# Patient Record
Sex: Female | Born: 1935 | Race: White | Hispanic: No | Marital: Married | Smoking: Never smoker
Health system: Southern US, Community
[De-identification: ages and names within clinical notes are randomized; demographics above are authoritative.]

## PROBLEM LIST (undated history)

## (undated) DIAGNOSIS — N189 Chronic kidney disease, unspecified: Secondary | ICD-10-CM

## (undated) DIAGNOSIS — F0390 Unspecified dementia without behavioral disturbance: Secondary | ICD-10-CM

## (undated) DIAGNOSIS — I10 Essential (primary) hypertension: Secondary | ICD-10-CM

## (undated) DIAGNOSIS — E119 Type 2 diabetes mellitus without complications: Secondary | ICD-10-CM

## (undated) HISTORY — PX: CHOLECYSTECTOMY: SHX55

## (undated) HISTORY — PX: ABDOMINAL HYSTERECTOMY: SHX81

---

## 2003-12-07 ENCOUNTER — Ambulatory Visit: Payer: Self-pay | Admitting: Ophthalmology

## 2003-12-13 ENCOUNTER — Ambulatory Visit: Payer: Self-pay | Admitting: Ophthalmology

## 2004-10-05 ENCOUNTER — Ambulatory Visit: Payer: Self-pay | Admitting: Unknown Physician Specialty

## 2005-02-22 ENCOUNTER — Emergency Department: Payer: Self-pay | Admitting: Emergency Medicine

## 2006-01-06 ENCOUNTER — Ambulatory Visit: Payer: Self-pay | Admitting: Unknown Physician Specialty

## 2006-03-11 ENCOUNTER — Ambulatory Visit: Payer: Self-pay | Admitting: Unknown Physician Specialty

## 2007-02-23 ENCOUNTER — Ambulatory Visit: Payer: Self-pay | Admitting: Unknown Physician Specialty

## 2008-06-24 ENCOUNTER — Ambulatory Visit: Payer: Self-pay | Admitting: Unknown Physician Specialty

## 2009-06-07 ENCOUNTER — Ambulatory Visit: Payer: Self-pay | Admitting: Unknown Physician Specialty

## 2010-06-14 ENCOUNTER — Ambulatory Visit: Payer: Self-pay | Admitting: Unknown Physician Specialty

## 2011-07-30 ENCOUNTER — Ambulatory Visit: Payer: Self-pay | Admitting: Unknown Physician Specialty

## 2012-05-07 ENCOUNTER — Emergency Department: Payer: Self-pay | Admitting: Emergency Medicine

## 2012-05-07 LAB — COMPREHENSIVE METABOLIC PANEL
Alkaline Phosphatase: 93 U/L (ref 50–136)
Anion Gap: 6 — ABNORMAL LOW (ref 7–16)
BUN: 37 mg/dL — ABNORMAL HIGH (ref 7–18)
Bilirubin,Total: 0.3 mg/dL (ref 0.2–1.0)
Co2: 27 mmol/L (ref 21–32)
EGFR (African American): 26 — ABNORMAL LOW
EGFR (Non-African Amer.): 22 — ABNORMAL LOW
Glucose: 125 mg/dL — ABNORMAL HIGH (ref 65–99)
Osmolality: 282 (ref 275–301)
Potassium: 4.8 mmol/L (ref 3.5–5.1)
SGOT(AST): 19 U/L (ref 15–37)
SGPT (ALT): 14 U/L (ref 12–78)
Sodium: 136 mmol/L (ref 136–145)

## 2012-05-07 LAB — CBC
HGB: 14.2 g/dL (ref 12.0–16.0)
MCHC: 33.8 g/dL (ref 32.0–36.0)
MCV: 91 fL (ref 80–100)
Platelet: 243 10*3/uL (ref 150–440)
RBC: 4.66 10*6/uL (ref 3.80–5.20)
WBC: 10.6 10*3/uL (ref 3.6–11.0)

## 2012-08-04 ENCOUNTER — Ambulatory Visit: Payer: Self-pay

## 2014-03-02 ENCOUNTER — Inpatient Hospital Stay: Payer: Self-pay | Admitting: Internal Medicine

## 2014-03-02 LAB — CBC
HCT: 40.2 % (ref 35.0–47.0)
HGB: 13.4 g/dL (ref 12.0–16.0)
MCH: 29.9 pg (ref 26.0–34.0)
MCHC: 33.3 g/dL (ref 32.0–36.0)
MCV: 90 fL (ref 80–100)
Platelet: 208 10*3/uL (ref 150–440)
RBC: 4.48 10*6/uL (ref 3.80–5.20)
RDW: 14.3 % (ref 11.5–14.5)
WBC: 8.9 10*3/uL (ref 3.6–11.0)

## 2014-03-02 LAB — CK TOTAL AND CKMB (NOT AT ARMC)
CK, Total: 572 U/L — ABNORMAL HIGH (ref 26–192)
CK-MB: 1.7 ng/mL (ref 0.5–3.6)

## 2014-03-02 LAB — COMPREHENSIVE METABOLIC PANEL
ALBUMIN: 3 g/dL — AB (ref 3.4–5.0)
ALK PHOS: 100 U/L
Anion Gap: 7 (ref 7–16)
BUN: 85 mg/dL — ABNORMAL HIGH (ref 7–18)
Bilirubin,Total: 0.4 mg/dL (ref 0.2–1.0)
CALCIUM: 8.8 mg/dL (ref 8.5–10.1)
Chloride: 98 mmol/L (ref 98–107)
Co2: 29 mmol/L (ref 21–32)
Creatinine: 3.82 mg/dL — ABNORMAL HIGH (ref 0.60–1.30)
EGFR (African American): 14 — ABNORMAL LOW
EGFR (Non-African Amer.): 12 — ABNORMAL LOW
GLUCOSE: 237 mg/dL — AB (ref 65–99)
Osmolality: 302 (ref 275–301)
Potassium: 3.9 mmol/L (ref 3.5–5.1)
SGOT(AST): 38 U/L — ABNORMAL HIGH (ref 15–37)
SGPT (ALT): 16 U/L
Sodium: 134 mmol/L — ABNORMAL LOW (ref 136–145)
TOTAL PROTEIN: 7.1 g/dL (ref 6.4–8.2)

## 2014-03-02 LAB — URINALYSIS, COMPLETE
BILIRUBIN, UR: NEGATIVE
Bacteria: NEGATIVE
Blood: NEGATIVE
Glucose,UR: NEGATIVE mg/dL (ref 0–75)
KETONE: NEGATIVE
Leukocyte Esterase: NEGATIVE
Nitrite: NEGATIVE
PH: 5 (ref 4.5–8.0)
PROTEIN: NEGATIVE
SPECIFIC GRAVITY: 1.005 (ref 1.003–1.030)
WBC UR: NONE SEEN /HPF (ref 0–5)

## 2014-03-02 LAB — TROPONIN I: Troponin-I: <0.02 ng/mL

## 2014-03-02 LAB — PROTIME-INR
INR: 1
Prothrombin Time: 12.9 secs (ref 11.5–14.7)

## 2014-03-02 LAB — HEMOGLOBIN A1C: HEMOGLOBIN A1C: 9.6 % — AB (ref 4.2–6.3)

## 2014-03-02 LAB — APTT: Activated PTT: 26.9 secs (ref 23.6–35.9)

## 2014-03-02 LAB — PRO B NATRIURETIC PEPTIDE: B-TYPE NATIURETIC PEPTID: 207 pg/mL (ref 0–450)

## 2014-03-03 LAB — CBC WITH DIFFERENTIAL/PLATELET
BASOS PCT: 0.5 %
Basophil #: 0 10*3/uL (ref 0.0–0.1)
EOS PCT: 3.7 %
Eosinophil #: 0.3 10*3/uL (ref 0.0–0.7)
HCT: 36.5 % (ref 35.0–47.0)
HGB: 12.1 g/dL (ref 12.0–16.0)
Lymphocyte #: 1.1 10*3/uL (ref 1.0–3.6)
Lymphocyte %: 13.2 %
MCH: 30.1 pg (ref 26.0–34.0)
MCHC: 33.2 g/dL (ref 32.0–36.0)
MCV: 91 fL (ref 80–100)
Monocyte #: 1 x10 3/mm — ABNORMAL HIGH (ref 0.2–0.9)
Monocyte %: 11.6 %
NEUTROS PCT: 71 %
Neutrophil #: 5.9 10*3/uL (ref 1.4–6.5)
Platelet: 194 10*3/uL (ref 150–440)
RBC: 4.03 10*6/uL (ref 3.80–5.20)
RDW: 14 % (ref 11.5–14.5)
WBC: 8.4 10*3/uL (ref 3.6–11.0)

## 2014-03-03 LAB — BASIC METABOLIC PANEL
Anion Gap: 10 (ref 7–16)
BUN: 79 mg/dL — ABNORMAL HIGH (ref 7–18)
CALCIUM: 8.2 mg/dL — AB (ref 8.5–10.1)
Chloride: 104 mmol/L (ref 98–107)
Co2: 26 mmol/L (ref 21–32)
Creatinine: 3.11 mg/dL — ABNORMAL HIGH (ref 0.60–1.30)
GFR CALC AF AMER: 18 — AB
GFR CALC NON AF AMER: 15 — AB
GLUCOSE: 185 mg/dL — AB (ref 65–99)
OSMOLALITY: 308 (ref 275–301)
Potassium: 3.5 mmol/L (ref 3.5–5.1)
Sodium: 140 mmol/L (ref 136–145)

## 2014-03-03 LAB — MAGNESIUM: Magnesium: 2.9 mg/dL — ABNORMAL HIGH

## 2014-03-04 LAB — BASIC METABOLIC PANEL
ANION GAP: 8 (ref 7–16)
BUN: 60 mg/dL — ABNORMAL HIGH (ref 7–18)
CREATININE: 2.46 mg/dL — AB (ref 0.60–1.30)
Calcium, Total: 8.4 mg/dL — ABNORMAL LOW (ref 8.5–10.1)
Chloride: 110 mmol/L — ABNORMAL HIGH (ref 98–107)
Co2: 25 mmol/L (ref 21–32)
EGFR (Non-African Amer.): 20 — ABNORMAL LOW
GFR CALC AF AMER: 24 — AB
Glucose: 91 mg/dL (ref 65–99)
Osmolality: 301 (ref 275–301)
Potassium: 3.4 mmol/L — ABNORMAL LOW (ref 3.5–5.1)
Sodium: 143 mmol/L (ref 136–145)

## 2014-03-05 LAB — BASIC METABOLIC PANEL
ANION GAP: 9 (ref 7–16)
BUN: 45 mg/dL — AB (ref 7–18)
CHLORIDE: 111 mmol/L — AB (ref 98–107)
CO2: 25 mmol/L (ref 21–32)
CREATININE: 2.11 mg/dL — AB (ref 0.60–1.30)
Calcium, Total: 8.4 mg/dL — ABNORMAL LOW (ref 8.5–10.1)
EGFR (African American): 28 — ABNORMAL LOW
EGFR (Non-African Amer.): 23 — ABNORMAL LOW
Glucose: 89 mg/dL (ref 65–99)
Osmolality: 300 (ref 275–301)
POTASSIUM: 3.6 mmol/L (ref 3.5–5.1)
Sodium: 145 mmol/L (ref 136–145)

## 2014-03-05 LAB — URINE CULTURE

## 2014-06-12 NOTE — Discharge Summary (Signed)
Dates of Admission and Diagnosis:  Date of Admission 02-Mar-2014   Date of Discharge 01-Jan-0001   Admitting Diagnosis Weakness, Acute on chronic renal failure, CKD 4, recurrent UTI with multidrug resistant organism   Final Diagnosis Weakness, Acute on chronic renal failure, CKD 4, recurrent UTI with multidrug resistant organism   Discharge Diagnosis 1 Weakness, Acute on chronic renal failure, CKD 4, recurrent UTI with multidrug resistant organism   2 Diabetes   3 Hypertension   4 Hyperlipidemia    Chief Complaint/History of Present Illness 81 year old lady with h/o recurrent UTI with multi-drug resistant organisms, (treated with Invanz in the recent past) Diabetes, Hypertension and Hyperlipidemia  presented with c/o weakness. ED work up revealed  Acute on chronic renal failure: Cr 3.82 and eGFR 12 on admission. Baseline Cr 2.2 and eGFR 21 on 11/22/2013.   Allergies:  PCN: Unknown  Sulfa drugs: Unknown    Hepatic:  20-Jan-16 10:54   Bilirubin, Total 0.4  Alkaline Phosphatase 100 (46-116 NOTE: New Reference Range 08/31/13)  SGPT (ALT) 16 (14-63 NOTE: New Reference Range 08/31/13)  SGOT (AST)  38  Total Protein, Serum 7.1  Albumin, Serum  3.0  Routine Micro:  21-Jan-16 18:49   Micro Text Report URINE CULTURE   COMMENT                   NO GROWTH IN 36 HOURS   ANTIBIOTIC                       Specimen Source CLEAN CATCH  Culture Comment NO GROWTH IN 9 HOURS  Result(s) reported on 05 Mar 2014 at 10:43AM.  Routine Chem:  20-Jan-16 10:54   Glucose, Serum  237  BUN  85  Creatinine (comp)  3.82  Sodium, Serum  134  Potassium, Serum 3.9  Chloride, Serum 98  CO2, Serum 29  Calcium (Total), Serum 8.8  Anion Gap 7  Osmolality (calc) 302  eGFR (African American)  14  eGFR (Non-African American)  12 (eGFR values <75mL/min/1.73 m2 may be an indication of chronic kidney disease (CKD). Calculated eGFR, using the MRDR Study equation, is useful in  patients with stable  renal function. The eGFR calculation will not be reliable in acutely ill patients when serum creatinine is changing rapidly. It is not useful in patients on dialysis. The eGFR calculation may not be applicable to patients at the low and high extremes of body sizes, pregnant women, and vegetarians.)  B-Type Natriuretic Peptide [facility]) 207 (Result(s) reported on 02 Mar 2014 at 11:28AM.)  21-Jan-16 04:38   Creatinine (comp)  3.11  eGFR (Non-African American)  15 (eGFR values <68mL/min/1.73 m2 may be an indication of chronic kidney disease (CKD). Calculated eGFR, using the MRDR Study equation, is useful in  patients with stable renal function. The eGFR calculation will not be reliable in acutely ill patients when serum creatinine is changing rapidly. It is not useful in patients on dialysis. The eGFR calculation may not be applicable to patients at the low and high extremes of body sizes, pregnant women, and vegetarians.)  22-Jan-16 04:48   Creatinine (comp)  2.46  eGFR (Non-African American)  20 (eGFR values <78mL/min/1.73 m2 may be an indication of chronic kidney disease (CKD). Calculated eGFR, using the MRDR Study equation, is useful in  patients with stable renal function. The eGFR calculation will not be reliable in acutely ill patients when serum creatinine is changing rapidly. It is not useful in patients on dialysis.  The eGFR calculation may not be applicable to patients at the low and high extremes of body sizes, pregnant women, and vegetarians.)  23-Jan-16 04:51   Glucose, Serum 89  BUN  45  Creatinine (comp)  2.11  Sodium, Serum 145  Potassium, Serum 3.6  Chloride, Serum  111  CO2, Serum 25  Calcium (Total), Serum  8.4  Anion Gap 9  Osmolality (calc) 300  eGFR (African American)  28  eGFR (Non-African American)  23 (eGFR values <28mL/min/1.73 m2 may be an indication of chronic kidney disease (CKD). Calculated eGFR, using the MRDR Study equation, is useful in   patients with stable renal function. The eGFR calculation will not be reliable in acutely ill patients when serum creatinine is changing rapidly. It is not useful in patients on dialysis. The eGFR calculation may not be applicable to patients at the low and high extremes of body sizes, pregnant women, and vegetarians.)  Cardiac:  20-Jan-16 10:54   Troponin I < 0.02 (0.00-0.05 0.05 ng/mL or less: NEGATIVE  Repeat testing in 3-6 hrs  if clinically indicated. >0.05 ng/mL: POTENTIAL  MYOCARDIAL INJURY. Repeat  testing in 3-6 hrs if  clinically indicated. NOTE: An increase or decrease  of 30% or more on serial  testing suggests a  clinically important change)  CK, Total  572  CPK-MB, Serum 1.7 (Result(s) reported on 02 Mar 2014 at 11:28AM.)  Routine UA:  20-Jan-16 10:10   Color (UA) YELLOW  Clarity (UA) CLOUDY  Glucose (UA) NEGATIVE  Bilirubin (UA) NEGATIVE  Ketones (UA) NEGATIVE  Specific Gravity (UA) 1.005  Blood (UA) NEGATIVE  pH (UA) 5.0  Protein (UA) NEGATIVE  Nitrite (UA) NEGATIVE  Leukocyte Esterase (UA) NEGATIVE (Result(s) reported on 02 Mar 2014 at 12:06PM.)  RBC (UA) RARE  WBC (UA) NONE SEEN  Bacteria (UA) NEGATIVE  Epithelial Cells (UA) 0-5 / HPF  Mucous (UA) PRESENT  Routine Coag:  20-Jan-16 10:54   Prothrombin 12.9  INR 1.0 (INR reference interval applies to patients on anticoagulant therapy. A single INR therapeutic range for coumarins is not optimal for all indications; however, the suggested range for most indications is 2.0 - 3.0. Exceptions to the INR Reference Range may include: Prosthetic heart valves, acute myocardial infarction, prevention of myocardial infarction, and combinations of aspirin and anticoagulant. The need for a higher or lower target INR must be assessed individually. Reference: The Pharmacology and Management of the Vitamin K  antagonists: the seventh ACCP Conference on Antithrombotic and Thrombolytic Therapy. JKDTO.6712 Sept:126  (3suppl): [id number]. A HCT value >55% may artifactually increase the PT.  In one study,  the increase was an average of 25%. Reference:  "Effect on Routine and Special Coagulation Testing Values of Citrate Anticoagulant Adjustment in Patients with High HCT Values." American Journal of Clinical Pathology 2006;126:400-405.)  Activated PTT (APTT) 26.9 (A HCT value >55% may artifactually increase the APTT. In one study, the increase was an average of 19%. Reference: "Effect on Routine and Special Coagulation Testing Values of Citrate Anticoagulant Adjustment in Patients with High HCT Values." American Journal of Clinical Pathology 2006;126:400-405.)  Routine Hem:  20-Jan-16 10:54   WBC (CBC) 8.9  RBC (CBC) 4.48  Hemoglobin (CBC) 13.4  Hematocrit (CBC) 40.2  Platelet Count (CBC) 208 (Result(s) reported on 02 Mar 2014 at 11:17AM.)  MCV 90  MCH 29.9  MCHC 33.3  RDW 14.3   PERTINENT RADIOLOGY STUDIES: XRay:    20-Jan-16 10:31, Chest PA and Lateral  Chest PA and Lateral   REASON FOR  EXAM:    generalized weakness  COMMENTS:       PROCEDURE: DXR - DXR CHEST PA (OR AP) AND LATERAL  - Mar 02 2014 10:31AM     CLINICAL DATA:  Recent fall, generalized weakness    EXAM:  CHEST  2 VIEW    COMPARISON:  None.    FINDINGS:  Cardiac shadow is at the upper limits of normal in size. The overall  inspiratory effort is poor. Elevation of the right hemidiaphragm is  seen. Mild bibasilar atelectatic changes are seen. No focal  confluent infiltrate is noted. Postsurgical changes are noted in the  right axilla.     IMPRESSION:  Mild bibasilar atelectasis without focal confluent infiltrate.      Electronically Signed    By: Inez Catalina M.D.    On: 03/02/2014 10:51         Verified By: Everlene Farrier, M.D.,  [place]:    21-Jan-16 13:34, US Kidney Bilateral  US Kidney Bilateral   REASON FOR EXAM:    ARF, Cr 3.82  COMMENTS:       PROCEDURE: [place]  - US KIDNEY  - Mar 03 2014  1:34PM      CLINICAL DATA:  Acute renal failure.    EXAM:  RENAL/URINARY TRACT ULTRASOUND COMPLETE    COMPARISON:  None.    FINDINGS:  Right Kidney:  Length: 10.5 cm. Diffuse renal parenchymal thinning and increased  echogenicity noted. No mass or hydronephrosis visualized.    Left Kidney:    Length: 10.2 cm. Diffuse renal parenchymal thinning and increased  echogenicity noted. No mass or hydronephrosis visualized.    Bladder:    Appears normal for degree of bladder distention.     IMPRESSION:  Bilateral diffuse renal parenchymal thinning and increased  echogenicity, consistent with medical renal disease. No evidence of  hydronephrosis.      Electronically Signed    By: Earle Gell M.D.    On: 03/03/2014 13:41         Verified By: Marlaine Hind, M.D.,  CT:    20-Jan-16 11:00, CT Head Without Contrast  CT Head Without Contrast   REASON FOR EXAM:    generalized weakness  COMMENTS:       PROCEDURE: CT  - CT HEAD WITHOUT CONTRAST  - Mar 02 2014 11:00AM     CLINICAL DATA:  81 year old female with acute onset weakness and  lethargy. Initial encounter.    EXAM:  CT HEAD WITHOUT CONTRAST    TECHNIQUE:  Contiguous axial images were obtained from the base of the skull  through the vertex without intravenous contrast.  COMPARISON:  Normal noncontrast CT appearance of the brain. MRI  06/24/2008 and earlier.    FINDINGS:  No acute osseous abnormality identified. Visualized paranasal  sinuses and mastoids are clear. No acute orbit or scalp soft tissue  findings.    Calcified atherosclerosis at the skull base. No midline shift, mass  effect, or evidence of intracranial mass lesion. No  ventriculomegaly. Cerebral volume is within normal limits for age.  Mild for age scattered white matter hypodensity, nonspecific. No  evidence of cortically based acute infarction identified. No  suspicious intracranial vascular hyperdensity. Noacute intracranial  hemorrhage identified.    IMPRESSION:  No acute intracranial abnormality. Mild for age nonspecific white  matter changes.      Electronically Signed    By: Lars Pinks M.D.    On: 03/02/2014 11:40  Verified By: Gwenyth Bender. HALL,M.D.,   Pertinent Past History:  Pertinent Past History Stage 4 CKD Diabetes Hypertension Hyperlipidemia   Hospital Course:  Hospital Course 81 year old lady with h/o Diabetes, Hypertension and Hyperlipidemia admitted with weakness and Acute Renal Failure. h/o recurrent UTI with multi-drug resistant organisms, (treated with Invanz in the recent past). Initial Cr 3.82 on admission, improved gradually with IV hydration to 2.11 today. Baseline Cr 2.2 and eGFR 21 on 11/22/2013. Renal u/s and Nephrology consult were done. Negative urinalysis. Preliminary urine culture is negative. Colbert Ewing was discontinued. Negative CXR and CT head. Diabetes and hypertension were controlled with usual medications. Exam: Awake and Alert, NAD. HENT: no JVD, Chest: clear, CVS: RRR, Abdo: benign, Ext: no edema. Discharge home today. Schedule Urology eval as out-patient.   Condition on Discharge Stable   DISCHARGE INSTRUCTIONS HOME MEDS:  Medication Reconciliation: Patient's Home Medications at Discharge:     Medication Instructions  cozaar 100 mg oral tablet  0.5 tab(s) orally once a day   pravastatin 40 mg oral tablet  1 tab(s) orally once a day (at bedtime)   glimepiride 2 mg oral tablet  1 tab(s) orally once a day with breakfast   potassium chloride extended release 10 meq oral tablet, extended release  1 tab(s) orally once a day   furosemide 40 mg oral tablet  1 tab(s) orally once a day   donepezil 10 mg oral tablet  1 tab(s) orally once a day   novolog flexpen 100 units/ml subcutaneous solution  0-5 unit(s) subcutaneous, per sliding scale, TID with meals    STOP TAKING THE FOLLOWING MEDICATION(S):    lidocaine 1% injectable solution: 3.2 milliliter(s) injectable once a day, used to dilute  invanz inj. invanz 1 g injectable powder for injection: 0.5 gram(s) injectable once a day for 14 days  Physician's Instructions:  Diet Low Sodium  Carbohydrate Controlled (ADA) Diet   Activity Limitations As tolerated   Return to Work Not Applicable   Time frame for Follow Up Appointment 1-2 weeks  Dr. Glendon Axe   Time frame for Follow Up Appointment 2-4 weeks  Dr. Berton Mount, Marchele Decock(Attending Physician): [facility], [address], [place], [place] [address], [place], [place](Consultant): [place], [address] [place], [place], [place], [place] [address], [place] [phone]  Electronic Signatures: Glendon Axe (MD)  (Signed 23-Jan-16 13:15)  Authored: ADMISSION DATE AND DIAGNOSIS, CHIEF COMPLAINT/HPI, Allergies, PERTINENT LABS, PERTINENT RADIOLOGY STUDIES, PERTINENT PAST HISTORY, HOSPITAL COURSE, DISCHARGE INSTRUCTIONS HOME MEDS, PATIENT INSTRUCTIONS, Follow Up Physician   Last Updated: 23-Jan-16 13:15 by Glendon Axe (MD)

## 2014-06-12 NOTE — H&P (Signed)
PATIENT NAME:  Heather Curtis, Heather Curtis MR#:  [id number] DATE OF BIRTH:  11/01/1922  DATE OF ADMISSION:  03/02/2014  PRIMARY CARE PHYSICIAN: Miriam "Mimi" Merlinda Frederick, PA-C with Leotis Shames, MD.  REQUESTING PHYSICIAN: Sheryl L. Mindi Junker, MD   CHIEF COMPLAINT: Weakness.  HISTORY OF PRESENT ILLNESS: The patient is an 81 year old female with a known history of hypertension, recurrent UTIs, being admitted for acute renal failure. The patient has been feeling weak and lethargic over the last couple of days. She had recently finished a course of IV Invanz about 10 weeks ago and has been recently started again on the same antibiotic about a week ago, on 14th, for a total 10 days of course, for recurrent UTI by her primary care physician. She has been getting weak since then. She has 24/7 care at home provided by her daughter. She used to have [facility] coming, they have stopped recently, and her daughter is the one providing care, including the injection of Invanz also. This morning she was very weak and was requiring a lot of assistance to bedroom, did not fall. Due to worsening weakness, she was brought down to the Emergency Department via EMS. While in the ED, she was found to have creatinine of 3.82 which is significantly elevated from her baseline and she is being admitted for further evaluation and management.   PAST MEDICAL HISTORY: 1.  Shingles.  2.  Diabetes.  3.  Hypertension.  4.  Recurrent UTI. 5.  Urinary incontinence.  6.  Hyperlipidemia.    ALLERGIES: PENICILLIN AND SULFA DRUGS.   SOCIAL HISTORY: No smoking. No alcohol. She lives at home, has 24/7 home care. She lives with her husband. Her daughter helps out.   FAMILY HISTORY: Father had Parkinson disease, mother with Alzheimer's.   MEDICATIONS AT HOME: 1.  Cozaar 100 mg 1/2 tablet p.o. daily.  2.  Donepezil 10 mg p.o. daily.  3.  Lasix 40 mg p.o. daily. 4.  Glimepiride 2 mg p.o. daily.  5.  Invanz 1 gram, 1/2 tablet injectable  once a day for 14 days.  6.  Lidocaine for mixing the Invanz. 7.  NovoLog Flex 100 units subcutaneous b.i.d.  8.  Potassium chloride 10 mEq p.o. daily.  9.  Pravastatin 40 mg p.o. at bedtime.   REVIEW OF SYSTEMS: CONSTITUTIONAL: No fever. Positive for fatigue and weakness.  EYES: No blurred or double vision. EARS, NOSE, AND THROAT: No tinnitus or ear pain.  RESPIRATORY: No cough or hemoptysis.  CARDIOVASCULAR: No chest pain, orthopnea, edema.  GASTROINTESTINAL: No nausea, vomiting, diarrhea. GENITOURINARY: No dysuria or hematuria. Recurrent UTI. She does have a history of bladder incontinence. ENDOCRINOLOGY: No polyuria or nocturia. HEMATOLOGY: No anemia or easy bruising.  SKIN: No rash or lesion.  MUSCULOSKELETAL: Positive for arthritis. No muscle cramp. NEUROLOGIC: No tingling or numbness. Positive for weakness.  PSYCHIATRY: No history of anxiety or depression.   PHYSICAL EXAMINATION:  VITAL SIGNS: Temperature 97.8, heart rate 82 per minute, respirations 21 per minute, blood pressure 126/60. She is saturating 98% on room air.  GENERAL: The patient is an 81 year old female lying in the bed comfortably without any acute distress.  EYES: Pupils equal, round, reactive to light and accommodation. No scleral icterus. Extraocular muscles intact.  HENT: Head atraumatic, normocephalic. Oropharynx and nasopharynx clear.  NECK: Supple. No jugular venous distention. No thyroid enlargement or tenderness.  LUNGS: Clear to auscultation bilaterally. No wheezing, rales, rhonchi or crepitation.  ABDOMEN: Soft, nontender, obese, nondistended. Bowel sounds present. No organomegaly  or mass.  EXTREMITIES: No pedal edema, cyanosis, clubbing.  NEUROLOGIC: Cranial nerves II-XII intact. Muscle strength 5/5 in extremities. Sensation intact.  PSYCHIATRIC: The patient is alert and oriented x 3.  SKIN: No obvious rash, lesion or ulcer.  CARDIOVASCULAR: S1, S2 normal.  No murmur, rales or  gallop. MUSCULOSKELETAL: No joint effusion or tenderness.   LABORATORY PANEL: Negative UA. Normal coagulation panel. Normal CBC. Normal liver function tests. Normal first set of cardiac enzymes. BMP within normal limits except BUN of 85, creatinine 3.82, sodium of 134, blood sugar of 237.  Chest x-ray showed mild bibasilar atelectasis, no infiltrate.   CT scan of the head without contrast showed no acute intracranial abnormality.   EKG shows normal sinus rhythm, no major ST-T changes.   IMPRESSION AND PLAN:  1.  Acute on chronic kidney disease, stage 3, likely prerenal. We will hold her Lasix and ARB, monitor her renal function very closely, avoid any nephrotoxins. 2.  Weakness, likely metabolic in nature, multifactorial. We will get physical and occupational therapy evaluation and management. She may need a higher level of care. Her daughter is requesting hospital bed and some other DMEs for home use.  3.  Recurrent urinary tract infection. She is being treated with IV Invanz by her PCP. We will continue at this time.  4.  Hypertension. We will continue monitoring her blood pressure. Right now, we will hold her Lasix and ARB considering her renal failure.   CODE STATUS: DNR.   TOTAL TIME TAKING CARE OF THIS PATIENT: Forty-five minutes.   ____________________________ Ellamae SiaVipul S. Sherryll BurgerShah, MD vss:TT D: 03/02/2014 14:14:06 ET T: 03/02/2014 14:50:55 ET JOB#: [id number]  cc: Kazuo Durnil S. Sherryll BurgerShah, MD, <Dictator> Leotis ShamesJasmine Singh, MD Ellamae SiaVIPUL S [facility] MD ELECTRONICALLY SIGNED 03/03/2014 10:16

## 2014-06-13 ENCOUNTER — Ambulatory Visit
Admission: RE | Admit: 2014-06-13 | Discharge: 2014-06-13 | Disposition: A | Discharge status: Home / Self Care | Payer: Medicare Other | Source: Ambulatory Visit | Attending: Family Medicine | Admitting: Family Medicine

## 2014-06-13 ENCOUNTER — Other Ambulatory Visit: Payer: Self-pay | Admitting: Family Medicine

## 2014-06-13 DIAGNOSIS — R059 Cough, unspecified: Secondary | ICD-10-CM

## 2014-06-13 DIAGNOSIS — J9811 Atelectasis: Secondary | ICD-10-CM | POA: Insufficient documentation

## 2014-06-13 DIAGNOSIS — R05 Cough: Secondary | ICD-10-CM

## 2015-02-20 ENCOUNTER — Emergency Department: Payer: Medicare HMO

## 2015-02-20 ENCOUNTER — Emergency Department
Admission: EM | Admit: 2015-02-20 | Discharge: 2015-02-20 | Disposition: A | Discharge status: Home / Self Care | Payer: Medicare HMO | Attending: Emergency Medicine | Admitting: Emergency Medicine

## 2015-02-20 ENCOUNTER — Encounter: Payer: Self-pay | Admitting: Emergency Medicine

## 2015-02-20 DIAGNOSIS — F0390 Unspecified dementia without behavioral disturbance: Secondary | ICD-10-CM | POA: Insufficient documentation

## 2015-02-20 DIAGNOSIS — Z880 Allergy status to penicillin: Secondary | ICD-10-CM | POA: Diagnosis not present

## 2015-02-20 DIAGNOSIS — E119 Type 2 diabetes mellitus without complications: Secondary | ICD-10-CM | POA: Diagnosis not present

## 2015-02-20 DIAGNOSIS — I10 Essential (primary) hypertension: Secondary | ICD-10-CM | POA: Diagnosis not present

## 2015-02-20 DIAGNOSIS — M79674 Pain in right toe(s): Secondary | ICD-10-CM | POA: Diagnosis present

## 2015-02-20 DIAGNOSIS — L03031 Cellulitis of right toe: Secondary | ICD-10-CM

## 2015-02-20 HISTORY — DX: Unspecified dementia, unspecified severity, without behavioral disturbance, psychotic disturbance, mood disturbance, and anxiety: F03.90

## 2015-02-20 HISTORY — DX: Type 2 diabetes mellitus without complications: E11.9

## 2015-02-20 HISTORY — DX: Essential (primary) hypertension: I10

## 2015-02-20 LAB — CBC
HCT: 40.8 % (ref 35.0–47.0)
HEMOGLOBIN: 13.5 g/dL (ref 12.0–16.0)
MCH: 29.3 pg (ref 26.0–34.0)
MCHC: 33 g/dL (ref 32.0–36.0)
MCV: 88.9 fL (ref 80.0–100.0)
Platelets: 206 10*3/uL (ref 150–440)
RBC: 4.6 MIL/uL (ref 3.80–5.20)
RDW: 14.3 % (ref 11.5–14.5)
WBC: 10.2 10*3/uL (ref 3.6–11.0)

## 2015-02-20 LAB — COMPREHENSIVE METABOLIC PANEL
ALBUMIN: 3.4 g/dL — AB (ref 3.5–5.0)
ALK PHOS: 74 U/L (ref 38–126)
ALT: 13 U/L — ABNORMAL LOW (ref 14–54)
ANION GAP: 7 (ref 5–15)
AST: 14 U/L — ABNORMAL LOW (ref 15–41)
BILIRUBIN TOTAL: 0.5 mg/dL (ref 0.3–1.2)
BUN: 31 mg/dL — ABNORMAL HIGH (ref 6–20)
CALCIUM: 9.6 mg/dL (ref 8.9–10.3)
CO2: 22 mmol/L (ref 22–32)
Chloride: 107 mmol/L (ref 101–111)
Creatinine, Ser: 1.82 mg/dL — ABNORMAL HIGH (ref 0.44–1.00)
GFR calc non Af Amer: 23 mL/min — ABNORMAL LOW (ref >60)
GFR, EST AFRICAN AMERICAN: 27 mL/min — AB (ref >60)
Glucose, Bld: 112 mg/dL — ABNORMAL HIGH (ref 65–99)
POTASSIUM: 4.6 mmol/L (ref 3.5–5.1)
SODIUM: 136 mmol/L (ref 135–145)
TOTAL PROTEIN: 6.6 g/dL (ref 6.5–8.1)

## 2015-02-20 MED ORDER — SULFAMETHOXAZOLE-TRIMETHOPRIM 800-160 MG PO TABS: 2.0000 | ORAL_TABLET | Freq: Two times a day (BID) | ORAL | Status: AC

## 2015-02-20 MED ORDER — VANCOMYCIN HCL IN DEXTROSE 1-5 GM/200ML-% IV SOLN
1000.0000 mg | Freq: Once | INTRAVENOUS | Status: AC
  Administered 2015-02-20: 1000 mg via INTRAVENOUS
  Filled 2015-02-20: qty 200

## 2015-02-20 MED ORDER — BACITRACIN ZINC 500 UNIT/GM EX OINT
TOPICAL_OINTMENT | CUTANEOUS | Status: AC
  Administered 2015-02-20: 1
  Filled 2015-02-20: qty 0.9

## 2015-02-20 NOTE — Discharge Instructions (Signed)
Cellulitis °Cellulitis is an infection of the skin and the tissue beneath it. The infected area is usually red and tender. Cellulitis occurs most often in the arms and lower legs.  °CAUSES  °Cellulitis is caused by bacteria that enter the skin through cracks or cuts in the skin. The most common types of bacteria that cause cellulitis are staphylococci and streptococci. °SIGNS AND SYMPTOMS  °· Redness and warmth. °· Swelling. °· Tenderness or pain. °· Fever. °DIAGNOSIS  °Your health care provider can usually determine what is wrong based on a physical exam. Blood tests may also be done. °TREATMENT  °Treatment usually involves taking an antibiotic medicine. °HOME CARE INSTRUCTIONS  °· Take your antibiotic medicine as directed by your health care provider. Finish the antibiotic even if you start to feel better. °· Keep the infected arm or leg elevated to reduce swelling. °· Apply a warm cloth to the affected area up to 4 times per day to relieve pain. °· Take medicines only as directed by your health care provider. °· Keep all follow-up visits as directed by your health care provider. °SEEK MEDICAL CARE IF:  °· You notice red streaks coming from the infected area. °· Your red area gets larger or turns dark in color. °· Your bone or joint underneath the infected area becomes painful after the skin has healed. °· Your infection returns in the same area or another area. °· You notice a swollen bump in the infected area. °· You develop new symptoms. °· You have a fever. °SEEK IMMEDIATE MEDICAL CARE IF:  °· You feel very sleepy. °· You develop vomiting or diarrhea. °· You have a general ill feeling (malaise) with muscle aches and pains. °  °This information is not intended to replace advice given to you by your health care provider. Make sure you discuss any questions you have with your health care provider. °  °Document Released: 11/07/2004 Document Revised: 10/19/2014 Document Reviewed: 04/15/2011 °Elsevier Interactive  Patient Education ©2016 [employer]. ° °Please return immediately if condition worsens. Please contact her primary physician or the physician you were given for referral. If you have any specialist physicians involved in her treatment and plan please also contact them. Thank you for using [facility]. ° °

## 2015-02-20 NOTE — ED Notes (Signed)
Daughter arrives, states mom lives at her home with sitters.

## 2015-02-20 NOTE — ED Notes (Signed)
Patient arrives via EMS with reported R great toe infection. R great toe noted reddened with bloody drainage around nailbed. Patient states she does not know how long it has been sore.

## 2015-02-20 NOTE — ED Notes (Signed)
Patient unable to answer questions re med history etc. Arrives with DNR., await family arrival

## 2015-02-20 NOTE — ED Provider Notes (Signed)
Time Seen: Approximately ----------------------------------------- 3:16 PM on 02/20/2015 -----------------------------------------   I have reviewed the triage notes  Chief Complaint: Toe Pain   History of Present Illness: Heather Curtis is a 82 y.o. female who presents with what seems to be some new onset bleeding on her right foot. Patient now has 24 hour at home care and is currently here with her family. It was noticed by one of her caretakers that she had the redness of the toe. It is unknown how long this has occurred. The patient is unable to offer any history or review of systems herself secondary to significant dementia. Patient is a diabetic. Her blood sugars have remained within normal limits. No obvious known trauma Past Medical History  Diagnosis Date  . Diabetes mellitus without complication (HCC)   . Hypertension   . Dementia     There are no active problems to display for this patient.   Past Surgical History  Procedure Laterality Date  . Abdominal hysterectomy    . Cholecystectomy      Past Surgical History  Procedure Laterality Date  . Abdominal hysterectomy    . Cholecystectomy      No current outpatient prescriptions on file.  Allergies:  Penicillins  Family History: No family history on file.  Social History: Social History  Substance Use Topics  . Smoking status: Never Smoker   . Smokeless tobacco: None  . Alcohol Use: No     Review of Systems:   10 point review of systems was performed and was otherwise negative: Review of systems was acquired from the patient's family Constitutional: No fever Eyes: No visual disturbances ENT: No sore throat, ear pain Cardiac: No chest pain Respiratory: No shortness of breath, wheezing, or stridor Abdomen: No abdominal pain, no vomiting, No diarrhea Endocrine: No weight loss, No night sweats Extremities: No peripheral edema, cyanosis Skin: No rashes, easy bruising Neurologic: No focal weakness,  trouble with speech or swollowing Urologic: No dysuria, Hematuria, or urinary frequency   Physical Exam:  ED Triage Vitals  Enc Vitals Group     BP 02/20/15 1359 154/76 mmHg     Pulse Rate 02/20/15 1359 74     Resp 02/20/15 1359 18     Temp 02/20/15 1359 98.2 F (36.8 C)     Temp Source 02/20/15 1359 Oral     SpO2 02/20/15 1359 95 %     Weight 02/20/15 1359 165 lb (74.844 kg)     Height 02/20/15 1359 5\' 2"  (1.575 m)     Head Cir --      Peak Flow --      Pain Score --      Pain Loc --      Pain Edu? --      Excl. in GC? --     General: Awake , Alert , and Oriented times 3; GCS 15 Head: Normal cephalic , atraumatic Eyes: Pupils equal , round, reactive to light Nose/Throat: No nasal drainage, patent upper airway without erythema or exudate.  Neck: Supple, Full range of motion, No anterior adenopathy or palpable thyroid masses Lungs: Clear to ascultation without wheezes , rhonchi, or rales Heart: Regular rate, regular rhythm without murmurs , gallops , or rubs Abdomen: Soft, non tender without rebound, guarding , or rigidity; bowel sounds positive and symmetric in all 4 quadrants. No organomegaly .        Extremities: 2 examination of the right great toe shows a small amount of old blood which  was cleaned and the patient has erythema mainly on the anterior surface of her first metatarsal region. There is no erythema or induration on the posterior surface of the toe. Redness without any areas of darkening. There is no obvious trauma at this time. No obvious ingrown toenail. The toe was cleaned with alcohol swab and was no active bleeding at this time with what appears to be just redness at the base of the nailbed. Marking was applied to the area of redness that extends toward the base of the toe. The extremities otherwise neurovascularly intact with less than 2 second capillary refill. Neurologic: normal ambulation, Motor symmetric without deficits, sensory intact Skin: warm, dry, no  rashes   Labs:   All laboratory work was reviewed including any pertinent negatives or positives listed below:  Labs Reviewed  CBC  COMPREHENSIVE METABOLIC PANEL   review laboratory work showed no significant changes. She has some baseline renal insufficiency    Radiology:      I personally reviewed the radiologic studies EXAM: RIGHT GREAT TOE  COMPARISON: None.  FINDINGS: Three views of the right first toe submitted. No acute fracture or subluxation. There is mild soft tissue swelling probable cellulitis or infection. No bone destruction or bony erosion to suggest osteomyelitis.  IMPRESSION: No acute fracture or subluxation. No definite evidence of osteomyelitis. Soft tissue swelling distal toe.     ED Course: * Patient's wound was well cleaned and dressed on the right toe. There does not appear to be any indications of a felon. This may be simple paronychia with associated cellulitis. There does not appear to be any signs of an ingrown toenail at this time. Patient was started on IV vancomycin here in emergency department and she'll be discharged on double strength Bactrim. The area of redness was demarcated with a pen and the family was advised to keep close observation to see if it increases over the next 24 hours. Currently afebrile, normal white blood cell count, no significant hyperglycemia, and I felt could be treated on an outpatient basis. I did advise family to have a low threshold to bring the patient back to the emergency department if she does not improve for any of the above mentioned symptoms occur.   Assessment:  Cellulitis right great toe      Plan: * Outpatient management Patient was advised to return immediately if condition worsens. Patient was advised to follow up with their primary care physician or other specialized physicians involved in their outpatient care             Jennye MoccasinBrian S Quigley, MD 02/20/15 [id number]

## 2016-02-16 ENCOUNTER — Inpatient Hospital Stay
Admission: EM | Admit: 2016-02-16 | Discharge: 2016-02-21 | DRG: 682 | Disposition: A | Discharge status: Skilled Nursing Facility | Payer: Medicare HMO | Attending: Internal Medicine | Admitting: Internal Medicine

## 2016-02-16 ENCOUNTER — Emergency Department: Payer: Medicare HMO

## 2016-02-16 DIAGNOSIS — I2510 Atherosclerotic heart disease of native coronary artery without angina pectoris: Secondary | ICD-10-CM | POA: Diagnosis present

## 2016-02-16 DIAGNOSIS — N179 Acute kidney failure, unspecified: Principal | ICD-10-CM | POA: Diagnosis present

## 2016-02-16 DIAGNOSIS — E1122 Type 2 diabetes mellitus with diabetic chronic kidney disease: Secondary | ICD-10-CM | POA: Diagnosis present

## 2016-02-16 DIAGNOSIS — E860 Dehydration: Secondary | ICD-10-CM | POA: Diagnosis present

## 2016-02-16 DIAGNOSIS — G9341 Metabolic encephalopathy: Secondary | ICD-10-CM | POA: Diagnosis present

## 2016-02-16 DIAGNOSIS — B951 Streptococcus, group B, as the cause of diseases classified elsewhere: Secondary | ICD-10-CM | POA: Diagnosis present

## 2016-02-16 DIAGNOSIS — Z794 Long term (current) use of insulin: Secondary | ICD-10-CM

## 2016-02-16 DIAGNOSIS — R627 Adult failure to thrive: Secondary | ICD-10-CM | POA: Diagnosis present

## 2016-02-16 DIAGNOSIS — M6281 Muscle weakness (generalized): Secondary | ICD-10-CM

## 2016-02-16 DIAGNOSIS — B9689 Other specified bacterial agents as the cause of diseases classified elsewhere: Secondary | ICD-10-CM | POA: Diagnosis present

## 2016-02-16 DIAGNOSIS — F0390 Unspecified dementia without behavioral disturbance: Secondary | ICD-10-CM | POA: Diagnosis present

## 2016-02-16 DIAGNOSIS — R778 Other specified abnormalities of plasma proteins: Secondary | ICD-10-CM | POA: Diagnosis present

## 2016-02-16 DIAGNOSIS — Z66 Do not resuscitate: Secondary | ICD-10-CM | POA: Diagnosis present

## 2016-02-16 DIAGNOSIS — R059 Cough, unspecified: Secondary | ICD-10-CM

## 2016-02-16 DIAGNOSIS — R531 Weakness: Secondary | ICD-10-CM

## 2016-02-16 DIAGNOSIS — Z880 Allergy status to penicillin: Secondary | ICD-10-CM

## 2016-02-16 DIAGNOSIS — R05 Cough: Secondary | ICD-10-CM

## 2016-02-16 DIAGNOSIS — N183 Chronic kidney disease, stage 3 (moderate): Secondary | ICD-10-CM | POA: Diagnosis present

## 2016-02-16 DIAGNOSIS — R2681 Unsteadiness on feet: Secondary | ICD-10-CM

## 2016-02-16 DIAGNOSIS — Z79899 Other long term (current) drug therapy: Secondary | ICD-10-CM

## 2016-02-16 DIAGNOSIS — Z23 Encounter for immunization: Secondary | ICD-10-CM

## 2016-02-16 DIAGNOSIS — R7881 Bacteremia: Secondary | ICD-10-CM | POA: Diagnosis not present

## 2016-02-16 DIAGNOSIS — I129 Hypertensive chronic kidney disease with stage 1 through stage 4 chronic kidney disease, or unspecified chronic kidney disease: Secondary | ICD-10-CM | POA: Diagnosis present

## 2016-02-16 DIAGNOSIS — N390 Urinary tract infection, site not specified: Secondary | ICD-10-CM

## 2016-02-16 DIAGNOSIS — Z882 Allergy status to sulfonamides status: Secondary | ICD-10-CM

## 2016-02-16 DIAGNOSIS — Z22322 Carrier or suspected carrier of Methicillin resistant Staphylococcus aureus: Secondary | ICD-10-CM

## 2016-02-16 HISTORY — DX: Chronic kidney disease, unspecified: N18.9

## 2016-02-16 LAB — URINALYSIS, COMPLETE (UACMP) WITH MICROSCOPIC
BILIRUBIN URINE: NEGATIVE
GLUCOSE, UA: NEGATIVE mg/dL
KETONES UR: NEGATIVE mg/dL
NITRITE: NEGATIVE
PROTEIN: 100 mg/dL — AB
Specific Gravity, Urine: 1.014 (ref 1.005–1.030)
pH: 5 (ref 5.0–8.0)

## 2016-02-16 LAB — BASIC METABOLIC PANEL
ANION GAP: 7 (ref 5–15)
BUN: 51 mg/dL — ABNORMAL HIGH (ref 6–20)
CALCIUM: 8.9 mg/dL (ref 8.9–10.3)
CO2: 23 mmol/L (ref 22–32)
Chloride: 108 mmol/L (ref 101–111)
Creatinine, Ser: 2.23 mg/dL — ABNORMAL HIGH (ref 0.44–1.00)
GFR, EST AFRICAN AMERICAN: 21 mL/min — AB (ref >60)
GFR, EST NON AFRICAN AMERICAN: 18 mL/min — AB (ref >60)
Glucose, Bld: 217 mg/dL — ABNORMAL HIGH (ref 65–99)
POTASSIUM: 4.6 mmol/L (ref 3.5–5.1)
Sodium: 138 mmol/L (ref 135–145)

## 2016-02-16 LAB — RAPID INFLUENZA A&B ANTIGENS: Influenza B (ARMC): NEGATIVE

## 2016-02-16 LAB — TROPONIN I: Troponin I: 0.03 ng/mL (ref <0.03)

## 2016-02-16 LAB — CBC
HEMATOCRIT: 39.8 % (ref 35.0–47.0)
HEMOGLOBIN: 13.3 g/dL (ref 12.0–16.0)
MCH: 29.8 pg (ref 26.0–34.0)
MCHC: 33.5 g/dL (ref 32.0–36.0)
MCV: 88.9 fL (ref 80.0–100.0)
Platelets: 172 10*3/uL (ref 150–440)
RBC: 4.47 MIL/uL (ref 3.80–5.20)
RDW: 14.5 % (ref 11.5–14.5)
WBC: 9.6 10*3/uL (ref 3.6–11.0)

## 2016-02-16 LAB — RAPID INFLUENZA A&B ANTIGENS (ARMC ONLY): INFLUENZA A (ARMC): NEGATIVE

## 2016-02-16 MED ORDER — DEXTROSE 5 % IV SOLN: 1.0000 g | Freq: Once | INTRAVENOUS | Status: DC

## 2016-02-16 MED ORDER — SODIUM CHLORIDE 0.9 % IV BOLUS (SEPSIS)
500.0000 mL | Freq: Once | INTRAVENOUS | Status: AC
  Administered 2016-02-16: 500 mL via INTRAVENOUS

## 2016-02-16 MED ORDER — CEFTRIAXONE SODIUM-DEXTROSE 1-3.74 GM-% IV SOLR
1.0000 g | Freq: Once | INTRAVENOUS | Status: AC
  Administered 2016-02-16: 1 g via INTRAVENOUS
  Filled 2016-02-16: qty 50

## 2016-02-16 NOTE — ED Notes (Addendum)
Pt has abrasion with scab noted to left lateral elbow, right anterior knee. Pt has skin breakdown noted to bilateral buttocks with slight sanginous drainage noted. Left buttock appears like abrasion approx 10cmx4cm, and right buttock has abrasion like lesion noted approx10 x 2cm.

## 2016-02-16 NOTE — ED Triage Notes (Signed)
Pt from home with generalized weakness today per ems. fsbs 266 per ems. Pt moving all extremities. Skin slightly hot and dry. resps unlabored. Pt denies pain, states she feels thirsty. Per ems family states she is normally ambulatory with walker with assist but has not been able to ambulate today.

## 2016-02-16 NOTE — ED Notes (Signed)
Home meds reviewed with family. Family updated on wait time for provider assessment and updated regarding treatment and assessment provided thus far. Family verbalizes understanding.

## 2016-02-16 NOTE — ED Provider Notes (Signed)
[facility] Emergency Department Provider Note  ____________________________________________   I have reviewed the triage vital signs and the nursing notes.   HISTORY  Chief Complaint Weakness    HPI Heather Curtis is a 83 y.o. female who is DO NOT RESUSCITATE, presents today with generalized weakness for last few days as well as cough. Family concerned that she is dehydrated. She is not eating or drinking very much. She is not far off from her mental baseline although she is sometimes confusedpatient did fall today did not hit her head. It was a witnessed fall, she had some scuff marks on her elbow and her knee.  Level 5 chart caveat; no further history available due to patient status.  Past Medical History:  Diagnosis Date  . Dementia   . Diabetes mellitus without complication (HCC)   . Hypertension     There are no active problems to display for this patient.   Past Surgical History:  Procedure Laterality Date  . ABDOMINAL HYSTERECTOMY    . CHOLECYSTECTOMY      Prior to Admission medications   Medication Sig Start Date End Date Taking? Authorizing Provider  donepezil (ARICEPT) 10 MG tablet Take 10 mg by mouth at bedtime.   Yes Historical Provider, MD  glimepiride (AMARYL) 2 MG tablet Take 3 mg by mouth daily with breakfast.   Yes Historical Provider, MD  haloperidol (HALDOL) 0.5 MG tablet Take 0.5 mg by mouth every 8 (eight) hours as needed for agitation.   Yes Historical Provider, MD  insulin aspart (NOVOLOG) 100 UNIT/ML injection Inject 4 Units into the skin daily with breakfast.   Yes Historical Provider, MD  insulin detemir (LEVEMIR) 100 UNIT/ML injection Inject into the skin 2 (two) times daily.   Yes Historical Provider, MD  losartan (COZAAR) 50 MG tablet Take 50 mg by mouth daily.   Yes Historical Provider, MD  pravastatin (PRAVACHOL) 40 MG tablet Take 40 mg by mouth daily.   Yes Historical Provider, MD    Allergies Penicillins  No  family history on file.  Social History Social History  Substance Use Topics  . Smoking status: Never Smoker  . Smokeless tobacco: Not on file  . Alcohol use No    Review of Systems {*Level 5 chart caveat; no further history available due to patient status.  ____________________________________________   PHYSICAL EXAM:  VITAL SIGNS: ED Triage Vitals  Enc Vitals Group     BP 02/16/16 2130 (!) 144/84     Pulse Rate 02/16/16 2130 (!) 106     Resp 02/16/16 2120 16     Temp 02/16/16 2130 98.2 F (36.8 C)     Temp Source 02/16/16 2120 Oral     SpO2 02/16/16 2130 94 %     Weight 02/16/16 2120 167 lb (75.8 kg)     Height 02/16/16 2120 5\' 1"  (1.549 m)     Head Circumference --      Peak Flow --      Pain Score --      Pain Loc --      Pain Edu? --      Excl. in GC? --     Constitutional: Alert and Rates it name, sleepy but in no acute medical distress Eyes: Conjunctivae are normal. PERRL. EOMI. Head: Atraumatic. Nose: No congestion/rhinnorhea. Mouth/Throat: Mucous membranes are moist.  Oropharynx non-erythematous. Neck: No stridor.   Nontender with no meningismus Cardiovascular: Normal rate, regular rhythm. Grossly normal heart sounds.  Good peripheral circulation. Respiratory:  Normal respiratory effort.  No retractions. Normal rhonchi which seems to clear with cough Abdominal: Soft and nontender. No distention. No guarding no rebound Back:  There is no focal tenderness or step off.  there is no midline tenderness there are no lesions noted. there is no CVA tenderness Musculoskeletal: No lower extremity tenderness, no upper extremity tenderness. No joint effusions, no DVT signs strong distal pulses no edema or abrasion noted to the right knee and left elbow with no evidence of fracture Neurologic:  Normal speech and language. No gross focal neurologic deficits are appreciated.  Skin:  Skin is warm, dry and intact. No rash noted. Psychiatric: Mood and affect are normal.  Speech and behavior are normal.  ____________________________________________   LABS (all labs ordered are listed, but only abnormal results are displayed)  Labs Reviewed  BASIC METABOLIC PANEL - Abnormal; Notable for the following:       Result Value   Glucose, Bld 217 (*)    BUN 51 (*)    Creatinine, Ser 2.23 (*)    GFR calc non Af Amer 18 (*)    GFR calc Af Amer 21 (*)    All other components within normal limits  URINALYSIS, COMPLETE (UACMP) WITH MICROSCOPIC - Abnormal; Notable for the following:    Color, Urine YELLOW (*)    APPearance CLOUDY (*)    Hgb urine dipstick SMALL (*)    Protein, ur 100 (*)    Leukocytes, UA LARGE (*)    Bacteria, UA MANY (*)    Squamous Epithelial / LPF 0-5 (*)    All other components within normal limits  TROPONIN I - Abnormal; Notable for the following:    Troponin I 0.03 (*)    All other components within normal limits  URINE CULTURE  RAPID INFLUENZA A&B ANTIGENS ([facility] ONLY)  CULTURE, BLOOD (ROUTINE X 2)  CULTURE, BLOOD (ROUTINE X 2)  CBC   ____________________________________________  EKG  I personally interpreted any EKGs ordered by me or triage  ____________________________________________  RADIOLOGY  I reviewed any imaging ordered by me or triage that were performed during my shift and, if possible, patient and/or family made aware of any abnormal findings. ____________________________________________   PROCEDURES  Procedure(s) performed: None  Procedures  Critical Care performed: None  ____________________________________________   INITIAL IMPRESSION / ASSESSMENT AND PLAN / ED COURSE  Pertinent labs & imaging results that were available during my care of the patient were reviewed by me and considered in my medical decision making (see chart for details).  Patient suffering from weakness, cough, and family feels that she is dehydrated. She does have elevated creatinine from her last recorded one year ago, she has  a very significant urinary tract infection. Family states she is too weak to ambulate at her own with her walker as she normally can. She does live at home. We will give her antibiotics for this. Chest x-ray is pending. Patient will require a believe inpatient admission. Family and I discussed the benefits of trying to go home and they feel he cannot take care of her obviously at her age with a DO NOT RESUSCITATE status, the hospital is not always the solution to these problems but we will certainly give it the best chance we can with antibiotics and observation with IV fluids.  Clinical Course    ____________________________________________   FINAL CLINICAL IMPRESSION(S) / ED DIAGNOSES  Final diagnoses:  Cough      This chart was dictated using voice recognition software.  Despite best efforts to proofread,  errors can occur which can change meaning.      Jeanmarie Plant, MD 02/16/16 2241

## 2016-02-16 NOTE — ED Notes (Signed)
Family updated on care plan. Family verbalizes understanding.  

## 2016-02-17 ENCOUNTER — Encounter: Payer: Self-pay | Admitting: Internal Medicine

## 2016-02-17 DIAGNOSIS — E860 Dehydration: Secondary | ICD-10-CM | POA: Diagnosis present

## 2016-02-17 LAB — CBC
HCT: 37.3 % (ref 35.0–47.0)
Hemoglobin: 12.5 g/dL (ref 12.0–16.0)
MCH: 29.8 pg (ref 26.0–34.0)
MCHC: 33.4 g/dL (ref 32.0–36.0)
MCV: 89.1 fL (ref 80.0–100.0)
PLATELETS: 151 10*3/uL (ref 150–440)
RBC: 4.18 MIL/uL (ref 3.80–5.20)
RDW: 14.3 % (ref 11.5–14.5)
WBC: 8.2 10*3/uL (ref 3.6–11.0)

## 2016-02-17 LAB — GLUCOSE, CAPILLARY
GLUCOSE-CAPILLARY: 98 mg/dL (ref 65–99)
GLUCOSE-CAPILLARY: 51 mg/dL — AB (ref 65–99)
GLUCOSE-CAPILLARY: 84 mg/dL (ref 65–99)
GLUCOSE-CAPILLARY: 57 mg/dL — AB (ref 65–99)
Glucose-Capillary: 142 mg/dL — ABNORMAL HIGH (ref 65–99)
Glucose-Capillary: 87 mg/dL (ref 65–99)

## 2016-02-17 LAB — BASIC METABOLIC PANEL
Anion gap: 7 (ref 5–15)
BUN: 42 mg/dL — AB (ref 6–20)
CALCIUM: 8.6 mg/dL — AB (ref 8.9–10.3)
CO2: 22 mmol/L (ref 22–32)
CREATININE: 1.83 mg/dL — AB (ref 0.44–1.00)
Chloride: 112 mmol/L — ABNORMAL HIGH (ref 101–111)
GFR calc Af Amer: 27 mL/min — ABNORMAL LOW (ref >60)
GFR, EST NON AFRICAN AMERICAN: 23 mL/min — AB (ref >60)
GLUCOSE: 96 mg/dL (ref 65–99)
POTASSIUM: 3.9 mmol/L (ref 3.5–5.1)
SODIUM: 141 mmol/L (ref 135–145)

## 2016-02-17 MED ORDER — ONDANSETRON HCL 4 MG PO TABS: 4.0000 mg | ORAL_TABLET | Freq: Four times a day (QID) | ORAL | Status: DC | PRN

## 2016-02-17 MED ORDER — DONEPEZIL HCL 5 MG PO TABS
10.0000 mg | ORAL_TABLET | Freq: Every day | ORAL | Status: DC
  Administered 2016-02-17 – 2016-02-20 (×4): 10 mg via ORAL
  Filled 2016-02-17 (×4): qty 2

## 2016-02-17 MED ORDER — ACETAMINOPHEN 325 MG PO TABS
650.0000 mg | ORAL_TABLET | Freq: Four times a day (QID) | ORAL | Status: DC | PRN
  Administered 2016-02-18: 650 mg via ORAL
  Filled 2016-02-17: qty 2

## 2016-02-17 MED ORDER — SENNOSIDES-DOCUSATE SODIUM 8.6-50 MG PO TABS: 1.0000 | ORAL_TABLET | Freq: Every evening | ORAL | Status: DC | PRN

## 2016-02-17 MED ORDER — SODIUM CHLORIDE 0.9 % IV SOLN
INTRAVENOUS | Status: DC
  Administered 2016-02-17: 05:00:00 via INTRAVENOUS

## 2016-02-17 MED ORDER — INSULIN ASPART 100 UNIT/ML ~~LOC~~ SOLN
3.0000 [IU] | Freq: Three times a day (TID) | SUBCUTANEOUS | Status: DC
  Administered 2016-02-17 – 2016-02-21 (×12): 3 [IU] via SUBCUTANEOUS
  Filled 2016-02-17 (×12): qty 3

## 2016-02-17 MED ORDER — ACETAMINOPHEN 650 MG RE SUPP: 650.0000 mg | Freq: Four times a day (QID) | RECTAL | Status: DC | PRN

## 2016-02-17 MED ORDER — INSULIN ASPART 100 UNIT/ML ~~LOC~~ SOLN: 0.0000 [IU] | Freq: Every day | SUBCUTANEOUS | Status: DC

## 2016-02-17 MED ORDER — INSULIN ASPART 100 UNIT/ML ~~LOC~~ SOLN
0.0000 [IU] | Freq: Three times a day (TID) | SUBCUTANEOUS | Status: DC
  Administered 2016-02-17 – 2016-02-18 (×2): 1 [IU] via SUBCUTANEOUS
  Administered 2016-02-19 (×2): 2 [IU] via SUBCUTANEOUS
  Administered 2016-02-20: 1 [IU] via SUBCUTANEOUS
  Administered 2016-02-20 (×2): 2 [IU] via SUBCUTANEOUS
  Administered 2016-02-21: 1 [IU] via SUBCUTANEOUS
  Administered 2016-02-21 (×2): 2 [IU] via SUBCUTANEOUS
  Filled 2016-02-17 (×3): qty 2
  Filled 2016-02-17 (×3): qty 1
  Filled 2016-02-17: qty 2
  Filled 2016-02-17: qty 1
  Filled 2016-02-17 (×2): qty 2

## 2016-02-17 MED ORDER — ONDANSETRON HCL 4 MG/2ML IJ SOLN: 4.0000 mg | Freq: Four times a day (QID) | INTRAMUSCULAR | Status: DC | PRN

## 2016-02-17 MED ORDER — INFLUENZA VAC SPLIT QUAD 0.5 ML IM SUSY
0.5000 mL | PREFILLED_SYRINGE | INTRAMUSCULAR | Status: AC
  Administered 2016-02-18: 0.5 mL via INTRAMUSCULAR
  Filled 2016-02-17: qty 0.5

## 2016-02-17 MED ORDER — LOSARTAN POTASSIUM 50 MG PO TABS
50.0000 mg | ORAL_TABLET | Freq: Every day | ORAL | Status: DC
  Administered 2016-02-17 – 2016-02-21 (×5): 50 mg via ORAL
  Filled 2016-02-17 (×5): qty 1

## 2016-02-17 MED ORDER — GLIMEPIRIDE 2 MG PO TABS
3.0000 mg | ORAL_TABLET | Freq: Every day | ORAL | Status: DC
  Administered 2016-02-17: 3 mg via ORAL
  Filled 2016-02-17 (×2): qty 1

## 2016-02-17 MED ORDER — HEPARIN SODIUM (PORCINE) 5000 UNIT/ML IJ SOLN
5000.0000 [IU] | Freq: Three times a day (TID) | INTRAMUSCULAR | Status: DC
  Administered 2016-02-17 – 2016-02-21 (×13): 5000 [IU] via SUBCUTANEOUS
  Filled 2016-02-17 (×14): qty 1

## 2016-02-17 MED ORDER — PRAVASTATIN SODIUM 40 MG PO TABS
40.0000 mg | ORAL_TABLET | Freq: Every day | ORAL | Status: DC
  Administered 2016-02-17 – 2016-02-21 (×5): 40 mg via ORAL
  Filled 2016-02-17 (×5): qty 1

## 2016-02-17 MED ORDER — HALOPERIDOL 0.5 MG PO TABS
0.5000 mg | ORAL_TABLET | Freq: Three times a day (TID) | ORAL | Status: DC | PRN
  Filled 2016-02-17: qty 1

## 2016-02-17 NOTE — Care Management Note (Signed)
Case Management Note  Patient Details  Name: Heather Curtis MRN: [id number] Date of Birth: 1922/08/25  Subjective/Objective:  Daughters Heather Curtis and Heather Curtis are Mrs Heather Curtis's POA's. Daughters are requesting Mrs Heather Curtis be evaluated by Hospice for appropriateness for Hospice in home care. From List of  Area hospice providers Heather Curtis chose [facility] Curtis request for an evaluation for Hospice services was called and faxed to Heather [facility] at [facility]. Dr Heather Curtis is aware of this Hospice request by family.                    Action/Plan:   Expected Discharge Date:  02/19/16               Expected Discharge Plan:     In-House Referral:     Discharge planning Services     Post Acute Care Choice:    Choice offered to:     DME Arranged:    DME Agency:     HH Arranged:    HH Agency:     Status of Service:     If discussed at [employer] Meetings, dates discussed:    Additional Comments:  Heather Joubert A, RN 02/17/2016, 4:10 PM

## 2016-02-17 NOTE — Progress Notes (Addendum)
Pt changed for large incontinence of urine, pt remains drowsy but caregiver at bedside stated that pt does sleep at home quite a bit. After being laid flat for changing, pt developed a harsh cough productive of thick white sputum. Also per caregiver, pt's daughter wants to talk to someone regarding possible hospice help at home. Case mgt referral will be made.Also, pt has excoriated areas to bilat buttocks that are skin thickness deep. Per caregiver, this is not unusual for pt and it in fact looks better than ususal. Pt was left without brief and new foam dressings, including sacral pad, were placed.

## 2016-02-17 NOTE — ED Notes (Signed)
Transport to room 151

## 2016-02-17 NOTE — ED Notes (Signed)
Pt continues to have a dry brief. resps unlabored.

## 2016-02-17 NOTE — ED Notes (Signed)
Pt denies needs, resps unlabored. Call bell at right side. Blankets readjusted for comfort. Skin warm and dry at this time.

## 2016-02-17 NOTE — Progress Notes (Signed)
Dr. Seth BakeV paged and responded, discussed with her that pt is taking po and fluids, now coughing white sputum. Received order to d/c ivf.

## 2016-02-17 NOTE — Progress Notes (Signed)
FSBS recheck 87. Will cont to monitor

## 2016-02-17 NOTE — Progress Notes (Signed)
Pt fsbs 57 2 OJ and snack given. Will recheck BS in one hour

## 2016-02-17 NOTE — ED Notes (Signed)
Pt repositioned in bed for comfort. Call bell at left side.

## 2016-02-17 NOTE — H&P (Signed)
[facility] [place] [facility]   PATIENT NAME: Heather CoveHilda Auston    MR#:  [id number]  DATE OF BIRTH:  1922/03/14  DATE OF ADMISSION:  02/16/2016  PRIMARY CARE PHYSICIAN: Pcp Not In System   REQUESTING/REFERRING PHYSICIAN:   CHIEF COMPLAINT:   Chief Complaint  Patient presents with  . Weakness    HISTORY OF PRESENT ILLNESS: Heather Curtis  is a 83 y.o. female with a known history of Dementia, diabetes mellitus type 2, hypertension, chronic kidney disease presented to the emergency room after she was brought by family member says she was not eating well and drinking enough fluids. Patient also had an accidental fall but did not hit her head. She has some scuff marks over elbow and knee. Patient is awake and lying on the bed not much history could be obtained from the medication-she has dementia. She is not completely oriented to time place and person. Has generalized weakness and appears dry.  PAST MEDICAL HISTORY:   Past Medical History:  Diagnosis Date  . CKD (chronic kidney disease)   . Dementia   . Diabetes mellitus without complication (HCC)   . Hypertension     PAST SURGICAL HISTORY: Past Surgical History:  Procedure Laterality Date  . ABDOMINAL HYSTERECTOMY    . CHOLECYSTECTOMY      SOCIAL HISTORY:  Social History  Substance Use Topics  . Smoking status: Never Smoker  . Smokeless tobacco: Not on file  . Alcohol use No    FAMILY HISTORY: No family history on file.  DRUG ALLERGIES:  Allergies  Allergen Reactions  . Ace Inhibitors Cough  . Ciprofloxacin Other (See Comments)  . Other Other (See Comments)  . Penicillins Rash  . Sulfa Antibiotics Itching, Rash and Other (See Comments)    REVIEW OF SYSTEMS:  Could not be obtained as patient has dementia MEDICATIONS AT HOME:  Prior to Admission medications   Medication Sig Start Date End Date Taking? Authorizing Provider  donepezil (ARICEPT) 10 MG tablet Take 10 mg by mouth at bedtime.   Yes  Historical Provider, MD  glimepiride (AMARYL) 2 MG tablet Take 3 mg by mouth daily with breakfast.   Yes Historical Provider, MD  haloperidol (HALDOL) 0.5 MG tablet Take 0.5 mg by mouth every 8 (eight) hours as needed for agitation.   Yes Historical Provider, MD  insulin aspart (NOVOLOG) 100 UNIT/ML injection Inject 4 Units into the skin daily with breakfast.   Yes Historical Provider, MD  insulin detemir (LEVEMIR) 100 UNIT/ML injection Inject into the skin 2 (two) times daily.   Yes Historical Provider, MD  losartan (COZAAR) 50 MG tablet Take 50 mg by mouth daily.   Yes Historical Provider, MD  pravastatin (PRAVACHOL) 40 MG tablet Take 40 mg by mouth daily.   Yes Historical Provider, MD      PHYSICAL EXAMINATION:   VITAL SIGNS: Blood pressure (!) 158/71, pulse 78, temperature 98.2 F (36.8 C), temperature source Oral, resp. rate (!) 26, height 5\' 1"  (1.549 m), weight 75.8 kg (167 lb), SpO2 99 %.  GENERAL:  83 y.o.-year-old patient lying in the bed with no acute distress.  EYES: Pupils equal, round, reactive to light and accommodation. No scleral icterus. Extraocular muscles intact.  HEENT: Head atraumatic, normocephalic. Oropharynx dry and nasopharynx clear.  NECK:  Supple, no jugular venous distention. No thyroid enlargement, no tenderness.  LUNGS: Normal breath sounds bilaterally, no wheezing, rales,rhonchi or crepitation. No use of accessory muscles of respiration.  CARDIOVASCULAR: S1, S2  normal. No murmurs, rubs, or gallops.  ABDOMEN: Soft, nontender, nondistended. Bowel sounds present. No organomegaly or mass.  EXTREMITIES: No pedal edema, cyanosis, or clubbing.  NEUROLOGIC: Awake, alert, not completely oriented to time, place and person Moves all extremities. PSYCHIATRIC: could not be assessed SKIN: No obvious rash, lesion, or ulcer.   LABORATORY PANEL:   CBC  Recent Labs Lab 02/16/16 2124  WBC 9.6  HGB 13.3  HCT 39.8  PLT 172  MCV 88.9  MCH 29.8  MCHC 33.5  RDW 14.5    ------------------------------------------------------------------------------------------------------------------  Chemistries   Recent Labs Lab 02/16/16 2124  NA 138  K 4.6  CL 108  CO2 23  GLUCOSE 217*  BUN 51*  CREATININE 2.23*  CALCIUM 8.9   ------------------------------------------------------------------------------------------------------------------ estimated creatinine clearance is 15.3 mL/min (by C-G formula based on SCr of 2.23 mg/dL (H)). ------------------------------------------------------------------------------------------------------------------ No results for input(s): TSH, T4TOTAL, T3FREE, THYROIDAB in the last 72 hours.  Invalid input(s): FREET3   Coagulation profile No results for input(s): INR, PROTIME in the last 168 hours. ------------------------------------------------------------------------------------------------------------------- No results for input(s): DDIMER in the last 72 hours. -------------------------------------------------------------------------------------------------------------------  Cardiac Enzymes  Recent Labs Lab 02/16/16 2124  TROPONINI 0.03*   ------------------------------------------------------------------------------------------------------------------ Invalid input(s): POCBNP  ---------------------------------------------------------------------------------------------------------------  Urinalysis    Component Value Date/Time   COLORURINE YELLOW (A) 02/16/2016 2127   APPEARANCEUR CLOUDY (A) 02/16/2016 2127   APPEARANCEUR CLOUDY 03/02/2014 1010   LABSPEC 1.014 02/16/2016 2127   LABSPEC 1.005 03/02/2014 1010   PHURINE 5.0 02/16/2016 2127   GLUCOSEU NEGATIVE 02/16/2016 2127   GLUCOSEU NEGATIVE 03/02/2014 1010   HGBUR SMALL (A) 02/16/2016 2127   BILIRUBINUR NEGATIVE 02/16/2016 2127   BILIRUBINUR NEGATIVE 03/02/2014 1010   KETONESUR NEGATIVE 02/16/2016 2127   PROTEINUR 100 (A) 02/16/2016 2127   NITRITE  NEGATIVE 02/16/2016 2127   LEUKOCYTESUR LARGE (A) 02/16/2016 2127   LEUKOCYTESUR NEGATIVE 03/02/2014 1010     RADIOLOGY: Dg Chest Port 1 View  Result Date: 02/16/2016 CLINICAL DATA:  Initial evaluation for generalized weakness, cough. EXAM: PORTABLE CHEST 1 VIEW COMPARISON:  Prior radiograph from 06/13/2014. FINDINGS: Stable cardiomegaly. Mediastinal silhouette within normal limits. Aortic atherosclerosis noted. Lungs are hypoinflated. Diffuse vascular congestion with interstitial prominence, suggesting mild edema. No definite pleural effusion. No focal infiltrates identified. No pneumothorax. Surgical clips overlie the right axilla. No acute osseous abnormality. IMPRESSION: 1. Cardiomegaly with diffuse pulmonary vascular congestion and interstitial prominence, suggesting mild diffuse edema. 2. Aortic atherosclerosis. Electronically Signed   By: Rise Mu M.D.   On: 02/16/2016 22:56    EKG: Orders placed or performed during the hospital encounter of 02/16/16  . ED EKG  . ED EKG  . EKG 12-Lead  . EKG 12-Lead  . EKG 12-Lead  . EKG 12-Lead    IMPRESSION AND PLAN: 83 year old elderly female patient with history of type 2 diabetes mellitus, dementia, hypertension, chronic kidney disease presented to the emergency room with fall and weakness and poor oral intake. Admitting diagnosis 1. Dehydration 2. Failure to thrive 3. Accidental fall 4. Type 2 diabetes mellitus 5. Acute on chronic renal failure 6. Hypertension Treatment plan Admit patient to medical floor observation bed IV fluid hydration DVT prophylaxis subcutaneous heparin Follow-up renal function Supportive care. All the records are reviewed and case discussed with ED provider. Management plans discussed with the patient, family and they are in agreement.  CODE STATUS:DNR Code Status History    This patient does not have a recorded code status. Please follow your organizational policy for patients in this  situation.  TOTAL TIME TAKING CARE OF THIS PATIENT: 52 minutes.    Ihor Austin M.D on 02/17/2016 at 2:01 AM  Between 7am to 6pm - Pager - [phone]  After 6pm go to [email or web address] - password [email or web address]  [facility]  Office  [phone]  CC: Primary care physician; Pcp Not In System

## 2016-02-17 NOTE — ED Notes (Signed)
Pt denies needs at this time, brief is currently dry. Call bell at right side.

## 2016-02-17 NOTE — Progress Notes (Signed)
[facility] - [place] at [facility]   PATIENT NAME: Heather Curtis    MR#:  [id number]  DATE OF BIRTH:  08-22-1922  SUBJECTIVE:  CHIEF COMPLAINT:   Chief Complaint  Patient presents with  . Weakness   The patient has 83 year old female with past medical history significant for history of dementia, diabetes, hypertension, CAD, who presents to the hospital with complaints of poor oral intake, fall, weakness. She was admitted due to concerns of dehydration, initiated on IV fluids. Her creatinine has improved to baseline. Oral intake is normal. Her labs revealed mild elevation of troponin, pyuria, concerning for urinary tract infection. The patient was initiated on Rocephin. She feels good today, denies any pain or discomfort Review of Systems  Unable to perform ROS: Dementia    VITAL SIGNS: Blood pressure (!) 149/56, pulse 72, temperature 100.1 F (37.8 C), temperature source Axillary, resp. rate 16, height 5\' 1"  (1.549 m), weight 75.8 kg (167 lb), SpO2 93 %.  PHYSICAL EXAMINATION:   GENERAL:  83 y.o.-year-old patient lying in the bed with no acute distress.  EYES: Pupils equal, round, reactive to light and accommodation. No scleral icterus. Extraocular muscles intact.  HEENT: Head atraumatic, normocephalic. Oropharynx and nasopharynx clear.  NECK:  Supple, no jugular venous distention. No thyroid enlargement, no tenderness.  LUNGS: Normal breath sounds bilaterally, no wheezing, rales,rhonchi or crepitation. No use of accessory muscles of respiration.  CARDIOVASCULAR: S1, S2 normal. No murmurs, rubs, or gallops.  ABDOMEN: Soft, nontender, nondistended. Bowel sounds present. No organomegaly or mass.  EXTREMITIES: No pedal edema, cyanosis, or clubbing.  NEUROLOGIC: Cranial nerves II through XII are intact. Muscle strength 5/5 in all extremities. Sensation intact. Gait not checked.  PSYCHIATRIC: The patient is alert and oriented x 3.  SKIN: No obvious rash, lesion, or  ulcer.   ORDERS/RESULTS REVIEWED:   CBC  Recent Labs Lab 02/16/16 2124 02/17/16 0500  WBC 9.6 8.2  HGB 13.3 12.5  HCT 39.8 37.3  PLT 172 151  MCV 88.9 89.1  MCH 29.8 29.8  MCHC 33.5 33.4  RDW 14.5 14.3   ------------------------------------------------------------------------------------------------------------------  Chemistries   Recent Labs Lab 02/16/16 2124 02/17/16 0500  NA 138 141  K 4.6 3.9  CL 108 112*  CO2 23 22  GLUCOSE 217* 96  BUN 51* 42*  CREATININE 2.23* 1.83*  CALCIUM 8.9 8.6*   ------------------------------------------------------------------------------------------------------------------ estimated creatinine clearance is 18.7 mL/min (by C-G formula based on SCr of 1.83 mg/dL (H)). ------------------------------------------------------------------------------------------------------------------ No results for input(s): TSH, T4TOTAL, T3FREE, THYROIDAB in the last 72 hours.  Invalid input(s): FREET3  Cardiac Enzymes  Recent Labs Lab 02/16/16 2124  TROPONINI 0.03*   ------------------------------------------------------------------------------------------------------------------ Invalid input(s): POCBNP ---------------------------------------------------------------------------------------------------------------  RADIOLOGY: Dg Chest Port 1 View  Result Date: 02/16/2016 CLINICAL DATA:  Initial evaluation for generalized weakness, cough. EXAM: PORTABLE CHEST 1 VIEW COMPARISON:  Prior radiograph from 06/13/2014. FINDINGS: Stable cardiomegaly. Mediastinal silhouette within normal limits. Aortic atherosclerosis noted. Lungs are hypoinflated. Diffuse vascular congestion with interstitial prominence, suggesting mild edema. No definite pleural effusion. No focal infiltrates identified. No pneumothorax. Surgical clips overlie the right axilla. No acute osseous abnormality. IMPRESSION: 1. Cardiomegaly with diffuse pulmonary vascular congestion and  interstitial prominence, suggesting mild diffuse edema. 2. Aortic atherosclerosis. Electronically Signed   By: Rise Mu M.D.   On: 02/16/2016 22:56    EKG:  Orders placed or performed during the hospital encounter of 02/16/16  . ED EKG  . ED EKG  . EKG 12-Lead  . EKG 12-Lead  .  EKG 12-Lead  . EKG 12-Lead    ASSESSMENT AND PLAN:  Active Problems:   Dehydration  #1. Urinary tract infection, continue Rocephin, awaiting for urinary cultures, blood cultures are negative so far #2 metabolic encephalopathy, improved clinically, follow clinically #3. Generalized weakness, physical therapist. Consultation is requested, pending #4. Acute on chronic renal insufficiency, improved to baseline, discontinue IV fluids #5. Elevated troponin, getting echocardiogram and cardiology consultation if needed, suspect demand ischemia  Management plans discussed with the patient, family and they are in agreement.   DRUG ALLERGIES:  Allergies  Allergen Reactions  . Ace Inhibitors Cough  . Ciprofloxacin Other (See Comments)  . Other Other (See Comments)  . Penicillins Rash  . Sulfa Antibiotics Itching, Rash and Other (See Comments)    CODE STATUS:     Code Status Orders        Start     Ordered   02/17/16 0458  Do not attempt resuscitation (DNR)  Continuous    Question Answer Comment  In the event of cardiac or respiratory ARREST Do not call a "code blue"   In the event of cardiac or respiratory ARREST Do not perform Intubation, CPR, defibrillation or ACLS   In the event of cardiac or respiratory ARREST Use medication by any route, position, wound care, and other measures to relive pain and suffering. May use oxygen, suction and manual treatment of airway obstruction as needed for comfort.      02/17/16 0458    Code Status History    Date Active Date Inactive Code Status Order ID Comments User Context   This patient has a current code status but no historical code status.     Advance Directive Documentation   Flowsheet Row Most Recent Value  Type of Advance Directive  Out of facility DNR (pink MOST or yellow form)  Pre-existing out of facility DNR order (yellow form or pink MOST form)  No data  "MOST" Form in Place?  No data      TOTAL TIME TAKING CARE OF THIS PATIENT: 40 minutes.    Katharina CaperVAICKUTE,Aleasha Fregeau M.D on 02/17/2016 at 4:05 PM  Between 7am to 6pm - Pager - [phone]  After 6pm go to [email or web address] - password [email or web address]  Fabio Neighbors[facility]  Office  [phone]  CC: Primary care physician; Pcp Not In System

## 2016-02-17 NOTE — Plan of Care (Signed)
Problem: Bowel/Gastric: Goal: Will not experience complications related to bowel motility Outcome: Progressing Pt has increased po intake, is voiding well, remains drowsy. Family is working with case mgt to ensure pt needs are met on d/c home. Pt has remained free of injury this shift. srx2.

## 2016-02-17 NOTE — Progress Notes (Signed)
Pt's fs prior to dinner was 51. Pt was given apple juice po, and recheck was 84. Pt able to mentate through this and follow commands, did not appear symptomatic.

## 2016-02-17 NOTE — Progress Notes (Signed)
Shift assessment completed at 0740. Pt resting in bed with eyes closed, was easily awakened, is oriented to self and place. Caregiver at bedside, who said that pt has 24 hour care at home. Skin is warm and dry, lungs are clear bilat, pt is on room air. telebox in place, SR noted, abdomen is soft, bs heard. Pt is wearing incontinence brief.ppp, no edema noted. PIV #20 intact to R arm with iv ns infusing at 6175mls/hr, site is free of redness and swelling. Since that time. Family members have been in to visit. Pt has slept mostly. This Clinical research associatewriter called Dr V and received order for FS with sliding scale coverage. Pt has not appeared to be in pain. Family members have fed pt, although they state that pt is able to feed herself.

## 2016-02-17 NOTE — ED Notes (Signed)
Pt cleansed of urine in brief. Pt repositioned on left side with pillows to protect breakdown on buttocks.

## 2016-02-18 ENCOUNTER — Inpatient Hospital Stay
Admit: 2016-02-18 | Discharge: 2016-02-18 | Disposition: A | Discharge status: Home / Self Care | Payer: Medicare HMO | Attending: Internal Medicine | Admitting: Internal Medicine

## 2016-02-18 DIAGNOSIS — B9689 Other specified bacterial agents as the cause of diseases classified elsewhere: Secondary | ICD-10-CM | POA: Diagnosis present

## 2016-02-18 DIAGNOSIS — E1122 Type 2 diabetes mellitus with diabetic chronic kidney disease: Secondary | ICD-10-CM | POA: Diagnosis present

## 2016-02-18 DIAGNOSIS — R627 Adult failure to thrive: Secondary | ICD-10-CM | POA: Diagnosis present

## 2016-02-18 DIAGNOSIS — Z22322 Carrier or suspected carrier of Methicillin resistant Staphylococcus aureus: Secondary | ICD-10-CM | POA: Diagnosis not present

## 2016-02-18 DIAGNOSIS — I129 Hypertensive chronic kidney disease with stage 1 through stage 4 chronic kidney disease, or unspecified chronic kidney disease: Secondary | ICD-10-CM | POA: Diagnosis present

## 2016-02-18 DIAGNOSIS — Z794 Long term (current) use of insulin: Secondary | ICD-10-CM | POA: Diagnosis not present

## 2016-02-18 DIAGNOSIS — R778 Other specified abnormalities of plasma proteins: Secondary | ICD-10-CM | POA: Diagnosis present

## 2016-02-18 DIAGNOSIS — I2510 Atherosclerotic heart disease of native coronary artery without angina pectoris: Secondary | ICD-10-CM | POA: Diagnosis present

## 2016-02-18 DIAGNOSIS — Z66 Do not resuscitate: Secondary | ICD-10-CM | POA: Diagnosis present

## 2016-02-18 DIAGNOSIS — B951 Streptococcus, group B, as the cause of diseases classified elsewhere: Secondary | ICD-10-CM | POA: Diagnosis present

## 2016-02-18 DIAGNOSIS — N179 Acute kidney failure, unspecified: Secondary | ICD-10-CM | POA: Diagnosis present

## 2016-02-18 DIAGNOSIS — Z23 Encounter for immunization: Secondary | ICD-10-CM | POA: Diagnosis present

## 2016-02-18 DIAGNOSIS — R7881 Bacteremia: Secondary | ICD-10-CM | POA: Diagnosis present

## 2016-02-18 DIAGNOSIS — Z882 Allergy status to sulfonamides status: Secondary | ICD-10-CM | POA: Diagnosis not present

## 2016-02-18 DIAGNOSIS — N390 Urinary tract infection, site not specified: Secondary | ICD-10-CM | POA: Diagnosis present

## 2016-02-18 DIAGNOSIS — E860 Dehydration: Secondary | ICD-10-CM | POA: Diagnosis present

## 2016-02-18 DIAGNOSIS — N183 Chronic kidney disease, stage 3 (moderate): Secondary | ICD-10-CM | POA: Diagnosis present

## 2016-02-18 DIAGNOSIS — G9341 Metabolic encephalopathy: Secondary | ICD-10-CM | POA: Diagnosis present

## 2016-02-18 DIAGNOSIS — Z880 Allergy status to penicillin: Secondary | ICD-10-CM | POA: Diagnosis not present

## 2016-02-18 DIAGNOSIS — F0390 Unspecified dementia without behavioral disturbance: Secondary | ICD-10-CM | POA: Diagnosis present

## 2016-02-18 DIAGNOSIS — Z79899 Other long term (current) drug therapy: Secondary | ICD-10-CM | POA: Diagnosis not present

## 2016-02-18 LAB — BLOOD CULTURE ID PANEL (REFLEXED)
ACINETOBACTER BAUMANNII: NOT DETECTED
CANDIDA KRUSEI: NOT DETECTED
CANDIDA PARAPSILOSIS: NOT DETECTED
CANDIDA TROPICALIS: NOT DETECTED
Candida albicans: NOT DETECTED
Candida glabrata: NOT DETECTED
ESCHERICHIA COLI: NOT DETECTED
Enterobacter cloacae complex: NOT DETECTED
Enterobacteriaceae species: NOT DETECTED
Enterococcus species: NOT DETECTED
Haemophilus influenzae: NOT DETECTED
KLEBSIELLA OXYTOCA: NOT DETECTED
KLEBSIELLA PNEUMONIAE: NOT DETECTED
Listeria monocytogenes: NOT DETECTED
Methicillin resistance: DETECTED — AB
Neisseria meningitidis: NOT DETECTED
PSEUDOMONAS AERUGINOSA: NOT DETECTED
Proteus species: NOT DETECTED
SERRATIA MARCESCENS: NOT DETECTED
STREPTOCOCCUS PYOGENES: NOT DETECTED
Staphylococcus aureus (BCID): NOT DETECTED
Staphylococcus species: DETECTED — AB
Streptococcus agalactiae: NOT DETECTED
Streptococcus pneumoniae: NOT DETECTED
Streptococcus species: NOT DETECTED

## 2016-02-18 LAB — GLUCOSE, CAPILLARY
GLUCOSE-CAPILLARY: 72 mg/dL (ref 65–99)
GLUCOSE-CAPILLARY: 121 mg/dL — AB (ref 65–99)
Glucose-Capillary: 120 mg/dL — ABNORMAL HIGH (ref 65–99)
Glucose-Capillary: 136 mg/dL — ABNORMAL HIGH (ref 65–99)

## 2016-02-18 LAB — BASIC METABOLIC PANEL
ANION GAP: 7 (ref 5–15)
BUN: 41 mg/dL — ABNORMAL HIGH (ref 6–20)
CALCIUM: 8.2 mg/dL — AB (ref 8.9–10.3)
CHLORIDE: 105 mmol/L (ref 101–111)
CO2: 22 mmol/L (ref 22–32)
CREATININE: 1.89 mg/dL — AB (ref 0.44–1.00)
GFR calc non Af Amer: 22 mL/min — ABNORMAL LOW (ref >60)
GFR, EST AFRICAN AMERICAN: 26 mL/min — AB (ref >60)
Glucose, Bld: 82 mg/dL (ref 65–99)
Potassium: 4 mmol/L (ref 3.5–5.1)
SODIUM: 134 mmol/L — AB (ref 135–145)

## 2016-02-18 LAB — MRSA PCR SCREENING: MRSA by PCR: POSITIVE — AB

## 2016-02-18 LAB — HEMOGLOBIN A1C
HEMOGLOBIN A1C: 6 % — AB (ref 4.8–5.6)
MEAN PLASMA GLUCOSE: 126 mg/dL

## 2016-02-18 LAB — EXPECTORATED SPUTUM ASSESSMENT W GRAM STAIN, RFLX TO RESP C

## 2016-02-18 LAB — EXPECTORATED SPUTUM ASSESSMENT W REFEX TO RESP CULTURE

## 2016-02-18 MED ORDER — VANCOMYCIN HCL IN DEXTROSE 750-5 MG/150ML-% IV SOLN
750.0000 mg | INTRAVENOUS | Status: DC
  Administered 2016-02-18: 750 mg via INTRAVENOUS
  Filled 2016-02-18 (×2): qty 150

## 2016-02-18 MED ORDER — SODIUM CHLORIDE 0.9 % IV SOLN
500.0000 mg | Freq: Two times a day (BID) | INTRAVENOUS | Status: DC
  Administered 2016-02-18 – 2016-02-19 (×3): 500 mg via INTRAVENOUS
  Filled 2016-02-18 (×4): qty 0.5

## 2016-02-18 MED ORDER — CEFTRIAXONE SODIUM-DEXTROSE 1-3.74 GM-% IV SOLR
1.0000 g | Freq: Every day | INTRAVENOUS | Status: DC
  Administered 2016-02-18: 1 g via INTRAVENOUS
  Filled 2016-02-18 (×2): qty 50

## 2016-02-18 MED ORDER — MUPIROCIN 2 % EX OINT
TOPICAL_OINTMENT | Freq: Two times a day (BID) | CUTANEOUS | Status: DC
  Administered 2016-02-18 – 2016-02-20 (×5): via NASAL
  Administered 2016-02-20: 1 via NASAL
  Administered 2016-02-21: 09:00:00 via NASAL
  Filled 2016-02-18: qty 22

## 2016-02-18 MED ORDER — NITROGLYCERIN 2 % TD OINT
1.0000 [in_us] | TOPICAL_OINTMENT | Freq: Four times a day (QID) | TRANSDERMAL | Status: DC
  Administered 2016-02-18 – 2016-02-21 (×12): 1 [in_us] via TOPICAL
  Filled 2016-02-18 (×12): qty 1

## 2016-02-18 MED ORDER — VANCOMYCIN HCL IN DEXTROSE 1-5 GM/200ML-% IV SOLN
1000.0000 mg | Freq: Once | INTRAVENOUS | Status: AC
  Administered 2016-02-18: 1000 mg via INTRAVENOUS
  Filled 2016-02-18: qty 200

## 2016-02-18 MED ORDER — SODIUM CHLORIDE 0.9 % IV SOLN: INTRAVENOUS | Status: DC

## 2016-02-18 NOTE — Progress Notes (Signed)
Patient resting in bed. No complaints at this time. Caregiver out of the room at the moment. Continue to monitor.

## 2016-02-18 NOTE — Evaluation (Signed)
Physical Therapy Evaluation Patient Details Name: Heather SingerHilda C Curtis MRN: [id number] DOB: 23-Jul-1922 Today's Date: 02/18/2016   History of Present Illness  Pt is a 83 y/o F who presents s/p fall and decreased PO intake. Pt found to have UTI and MRSA. Pt's PMH includes dementia, CKD.    Clinical Impression  Pt admitted with above diagnosis. Pt currently with functional limitations due to the deficits listed below (see PT Problem List). Heather Curtis presents confused and no family present to confirm PLOF, home layout, or amount of assist available at home.  Pt with fall PTA and requires mod assist for bed mobility and heavy mod assist for sit>stand this session.  Pt incontinent of bowel and bladder once standing which limited session as pt needed to be cleaned up.  Recommending SNF vs. HHPT pending information gathered regarding amount of assist available from family/friends as pt will need 24/7 supervision/assist at d/c.  Noted that there have been discussion about Hospice in the home as well.       Follow Up Recommendations SNF;Home health PT;Supervision/Assistance - 24 hour (pending assist available from family/friends for 24/7 care)    Equipment Recommendations  Other (comment) (TBD as information obtained and pt progresses)    Recommendations for Other Services OT consult     Precautions / Restrictions Precautions Precautions: Fall Restrictions Weight Bearing Restrictions: No      Mobility  Bed Mobility Overal bed mobility: Needs Assistance Bed Mobility: Supine to Sit;Sit to Supine     Supine to sit: Mod assist;HOB elevated Sit to supine: Mod assist   General bed mobility comments: Mod assist managing BLEs and to elevate trunk.  Hand over hand for proper hand placement to use rail.  Again assist with BLEs to return to supine.  Pt had difficulty sc[facility] prior to attempting sit>supine.  Transfers Overall transfer level: Needs assistance Equipment used: Rolling walker (2  wheeled) Transfers: Sit to/from Stand Sit to Stand: Mod assist         General transfer comment: Heavy mod assist to boost to standing after hand over hand placement of UEs to prepare to stand.  Once standing for ~20 seconds pt incontinent of bowel and bladder and was assisted back to sitting.  Ambulation/Gait             General Gait Details: Did not attempt as pt needed to be cleaned up  Stairs            Wheelchair Mobility    Modified Rankin (Stroke Patients Only)       Balance Overall balance assessment: Needs assistance;History of Falls Sitting-balance support: No upper extremity supported;Feet supported Sitting balance-Leahy Scale: Fair     Standing balance support: Bilateral upper extremity supported;During functional activity Standing balance-Leahy Scale: Poor Standing balance comment: Relies on UE support to maintain static standing                             Pertinent Vitals/Pain Pain Assessment: No/denies pain    Home Living Family/patient expects to be discharged to:: Private residence Living Arrangements: Children (daughter per chart review) Available Help at Discharge: Family Type of Home: House           Additional Comments: No family present to provide information.  Pt replies "I don't know" to majority of questions.    Prior Function Level of Independence: Needs assistance   Gait / Transfers Assistance Needed: Pt likely required at  least supervision for OOB mobility.  She reports she was using a cane PTA.  ADL's / Homemaking Assistance Needed: Pt likely required assist with bathing, dressing.        Hand Dominance        Extremity/Trunk Assessment   Upper Extremity Assessment Upper Extremity Assessment: Generalized weakness (Strength grossly 3-/5)    Lower Extremity Assessment Lower Extremity Assessment: Generalized weakness (Strength grossly 3-/5)    Cervical / Trunk Assessment Cervical / Trunk  Assessment: Kyphotic  Communication   Communication: HOH  Cognition Arousal/Alertness: Awake/alert Behavior During Therapy: Anxious;Flat affect Overall Cognitive Status: History of cognitive impairments - at baseline                      General Comments General comments (skin integrity, edema, etc.): Pt moaning briefly (~5 seconds) with each position change (i.e. supine>sit and sit>stand).  She denies pain.  Suspect either fear of falling or orthostatic.    Exercises     Assessment/Plan    PT Assessment Patient needs continued PT services  PT Problem List Decreased strength;Decreased activity tolerance;Decreased balance;Decreased mobility;Decreased cognition;Decreased knowledge of use of DME;Decreased safety awareness          PT Treatment Interventions DME instruction;Gait training;Functional mobility training;Therapeutic activities;Therapeutic exercise;Balance training;Neuromuscular re-education;Cognitive remediation;Patient/family education;Wheelchair mobility training    PT Goals (Current goals can be found in the Care Plan section)  Acute Rehab PT Goals Patient Stated Goal: none stated PT Goal Formulation: With patient Time For Goal Achievement: 03/03/16 Potential to Achieve Goals: Good    Frequency Min 2X/week   Barriers to discharge   Unsure of available assist at home as no family present during evaluation    Co-evaluation               End of Session Equipment Utilized During Treatment: Gait belt Activity Tolerance: Other (comment);Patient tolerated treatment well (limited by incontinence) Patient left: with call bell/phone within reach;in bed;with bed alarm set;with nursing/sitter in room Nurse Communication: Mobility status;Other (comment) (RN aware that pt needs to be cleaned up)         Time: 1610-9604 PT Time Calculation (min) (ACUTE ONLY): 22 min   Charges:   PT Evaluation $PT Eval Low Complexity: 1 Procedure     PT G Codes:         Encarnacion Chu PT, DPT 02/18/2016, 3:41 PM

## 2016-02-18 NOTE — Care Management Obs Status (Signed)
MEDICARE OBSERVATION STATUS NOTIFICATION   Patient Details  Name: Heather Curtis MRN: [id number] Date of Birth: 05/27/1922   Medicare Observation Status Notification Given:  Yes    Norleen Xie A, RN 02/18/2016, 9:49 AM

## 2016-02-18 NOTE — Progress Notes (Signed)
[facility] - [place] at [facility]   PATIENT NAME: Heather Curtis    MR#:  [id number]  DATE OF BIRTH:  01/26/1923  SUBJECTIVE:  CHIEF COMPLAINT:   Chief Complaint  Patient presents with  . Weakness   The patient has 83 year old female with past medical history significant for history of dementia, diabetes, hypertension, CAD, who presents to the hospital with complaints of poor oral intake, fall, weakness. She was admitted due to concerns of dehydration, initiated on IV fluids. Her creatinine has improved to baseline. Oral intake is normal. Her labs revealed mild elevation of troponin, pyuria, concerning for urinary tract infection. The patient was initiated on Rocephin. She feels good today, denies any pain or discomfort. Urine culture revealed >=100,000 COLONIES/mL CITROBACTER FREUNDII , >=100,000 COLONIES/mL STREPTOCOCCUS AGALACTIAE, sputum cultures are pending, blood culture revealed MRSA, now on vancomycin and Zosyn. Repeated blood cultures are pending, echo is pending. Patient feels comfortable, no pain. She is febrile to close to 101 intermittently. White blood cell count is normal.    Review of Systems  Unable to perform ROS: Dementia    VITAL SIGNS: Blood pressure (!) 148/68, pulse 82, temperature (!) 100.4 F (38 C), temperature source Axillary, resp. rate 18, height 5\' 1"  (1.549 m), weight 75.8 kg (167 lb), SpO2 93 %.  PHYSICAL EXAMINATION:   GENERAL:  83 y.o.-year-old patient lying in the bed with no acute distress. Somnolent, arousable, able to communicate EYES: Pupils equal, round, reactive to light and accommodation. No scleral icterus. Extraocular muscles intact.  HEENT: Head atraumatic, normocephalic. Oropharynx and nasopharynx clear.  NECK:  Supple, no jugular venous distention. No thyroid enlargement, no tenderness.  LUNGS: Some diminished breath sounds bilaterally, no wheezing, rales,rhonchi or crepitation. No use of accessory muscles of  respiration.  CARDIOVASCULAR: S1, S2 normal. No murmurs, rubs, or gallops.  ABDOMEN: Soft, nontender, nondistended. Bowel sounds present. No organomegaly or mass.  EXTREMITIES: No pedal edema, cyanosis, or clubbing.  NEUROLOGIC: Cranial nerves II through XII are intact. Muscle strength 5/5 in all extremities. Sensation intact. Gait not checked.  PSYCHIATRIC: The patient is somnolent, not oriented  SKIN: No obvious rash, lesion, or ulcer.   ORDERS/RESULTS REVIEWED:   CBC  Recent Labs Lab 02/16/16 2124 02/17/16 0500  WBC 9.6 8.2  HGB 13.3 12.5  HCT 39.8 37.3  PLT 172 151  MCV 88.9 89.1  MCH 29.8 29.8  MCHC 33.5 33.4  RDW 14.5 14.3   ------------------------------------------------------------------------------------------------------------------  Chemistries   Recent Labs Lab 02/16/16 2124 02/17/16 0500 02/18/16 0727  NA 138 141 134*  K 4.6 3.9 4.0  CL 108 112* 105  CO2 23 22 22   GLUCOSE 217* 96 82  BUN 51* 42* 41*  CREATININE 2.23* 1.83* 1.89*  CALCIUM 8.9 8.6* 8.2*   ------------------------------------------------------------------------------------------------------------------ estimated creatinine clearance is 18.1 mL/min (by C-G formula based on SCr of 1.89 mg/dL (H)). ------------------------------------------------------------------------------------------------------------------ No results for input(s): TSH, T4TOTAL, T3FREE, THYROIDAB in the last 72 hours.  Invalid input(s): FREET3  Cardiac Enzymes  Recent Labs Lab 02/16/16 2124  TROPONINI 0.03*   ------------------------------------------------------------------------------------------------------------------ Invalid input(s): POCBNP ---------------------------------------------------------------------------------------------------------------  RADIOLOGY: Dg Chest Port 1 View  Result Date: 02/16/2016 CLINICAL DATA:  Initial evaluation for generalized weakness, cough. EXAM: PORTABLE CHEST 1 VIEW  COMPARISON:  Prior radiograph from 06/13/2014. FINDINGS: Stable cardiomegaly. Mediastinal silhouette within normal limits. Aortic atherosclerosis noted. Lungs are hypoinflated. Diffuse vascular congestion with interstitial prominence, suggesting mild edema. No definite pleural effusion. No focal infiltrates identified. No pneumothorax. Surgical clips overlie the right  axilla. No acute osseous abnormality. IMPRESSION: 1. Cardiomegaly with diffuse pulmonary vascular congestion and interstitial prominence, suggesting mild diffuse edema. 2. Aortic atherosclerosis. Electronically Signed   By: Rise MuBenjamin  McClintock M.D.   On: 02/16/2016 22:56    EKG:  Orders placed or performed during the hospital encounter of 02/16/16  . ED EKG  . ED EKG  . EKG 12-Lead  . EKG 12-Lead  . EKG 12-Lead  . EKG 12-Lead    ASSESSMENT AND PLAN:  Active Problems:   Dehydration   Bacteremia  #1. Urinary tract infection due to Citrobacter and Streptococcus, initiate patient on Zosyn, awaiting for culture . Sensitivities, blood cultures is positive for MRSA #2. MRSA bacteremia, repeating blood cultures, getting echocardiogram, ID consultation, awaiting for sputum culture. May need a PICC line and vancomycin intravenously, discussed with caregiver #3 metabolic encephalopathy, improved clinically, remains stable #4. Generalized weakness, physical therapist. Consultation is requested, pending #5. Acute on chronic renal insufficiency, improved to baseline, now off IV fluids #6. Elevated troponin, getting echocardiogram and cardiology consultation if needed, suspect demand ischemia, continue supportive care, add nitroglycerin topically  Management plans discussed with the patient, family and they are in agreement.   DRUG ALLERGIES:  Allergies  Allergen Reactions  . Ace Inhibitors Cough  . Ciprofloxacin Other (See Comments)  . Other Other (See Comments)  . Penicillins Rash  . Sulfa Antibiotics Itching, Rash and Other  (See Comments)    CODE STATUS:     Code Status Orders        Start     Ordered   02/17/16 0458  Do not attempt resuscitation (DNR)  Continuous    Question Answer Comment  In the event of cardiac or respiratory ARREST Do not call a "code blue"   In the event of cardiac or respiratory ARREST Do not perform Intubation, CPR, defibrillation or ACLS   In the event of cardiac or respiratory ARREST Use medication by any route, position, wound care, and other measures to relive pain and suffering. May use oxygen, suction and manual treatment of airway obstruction as needed for comfort.      02/17/16 0458    Code Status History    Date Active Date Inactive Code Status Order ID Comments User Context   This patient has a current code status but no historical code status.    Advance Directive Documentation   Flowsheet Row Most Recent Value  Type of Advance Directive  Out of facility DNR (pink MOST or yellow form)  Pre-existing out of facility DNR order (yellow form or pink MOST form)  No data  "MOST" Form in Place?  No data      TOTAL TIME TAKING CARE OF THIS PATIENT: 40 minutes.  Discussed with care management and the caregiver, all questions were answered, caregiver voiced understanding and appreciation  Adrina Armijo M.D on 02/18/2016 at 2:33 PM  Between 7am to 6pm - Pager - [phone]  After 6pm go to [email or web address] - password [email or web address]  [facility]  [facility]ospital[facility]  Office  [phone]  CC: Primary care physician; Pcp Not In System

## 2016-02-18 NOTE — Progress Notes (Signed)
PHARMACY - PHYSICIAN COMMUNICATION CRITICAL VALUE ALERT - BLOOD CULTURE IDENTIFICATION (BCID)  Results for orders placed or performed during the hospital encounter of 02/16/16  Blood Culture ID Panel (Reflexed) (Collected: 02/16/2016  9:50 PM)  Result Value Ref Range   Enterococcus species NOT DETECTED NOT DETECTED   Listeria monocytogenes NOT DETECTED NOT DETECTED   Staphylococcus species DETECTED (A) NOT DETECTED   Staphylococcus aureus NOT DETECTED NOT DETECTED   Methicillin resistance DETECTED (A) NOT DETECTED   Streptococcus species NOT DETECTED NOT DETECTED   Streptococcus agalactiae NOT DETECTED NOT DETECTED   Streptococcus pneumoniae NOT DETECTED NOT DETECTED   Streptococcus pyogenes NOT DETECTED NOT DETECTED   Acinetobacter baumannii NOT DETECTED NOT DETECTED   Enterobacteriaceae species NOT DETECTED NOT DETECTED   Enterobacter cloacae complex NOT DETECTED NOT DETECTED   Escherichia coli NOT DETECTED NOT DETECTED   Klebsiella oxytoca NOT DETECTED NOT DETECTED   Klebsiella pneumoniae NOT DETECTED NOT DETECTED   Proteus species NOT DETECTED NOT DETECTED   Serratia marcescens NOT DETECTED NOT DETECTED   Haemophilus influenzae NOT DETECTED NOT DETECTED   Neisseria meningitidis NOT DETECTED NOT DETECTED   Pseudomonas aeruginosa NOT DETECTED NOT DETECTED   Candida albicans NOT DETECTED NOT DETECTED   Candida glabrata NOT DETECTED NOT DETECTED   Candida krusei NOT DETECTED NOT DETECTED   Candida parapsilosis NOT DETECTED NOT DETECTED   Candida tropicalis NOT DETECTED NOT DETECTED    Name of physician (or Provider) Contacted: Dr. Emmit PomfretHugelmeyer  Changes to prescribed antibiotics required: Rocephin had not been reordered from ED. Restart Rocephin 1 gm IV Q24H. Give vancomycin 1 gm IV x 1 pending further culture results.  Carola FrostNathan A Cristie Mckinney, Pharm.D., BCPS Clinical Pharmacist 02/18/2016  12:48 AM

## 2016-02-18 NOTE — Progress Notes (Addendum)
ANTIBIOTIC CONSULT NOTE - INITIAL  Pharmacy Consult for vancomycin and meroepnem Indication: sepsis  Allergies  Allergen Reactions  . Ace Inhibitors Cough  . Ciprofloxacin Other (See Comments)  . Other Other (See Comments)  . Penicillins Rash  . Sulfa Antibiotics Itching, Rash and Other (See Comments)    Patient Measurements: Height: 5\' 1"  (154.9 cm) Weight: 167 lb (75.8 kg) IBW/kg (Calculated) : 47.8 Adjusted Body Weight: 59  Vital Signs: Temp: 99.5 F (37.5 C) (01/07 0805) Temp Source: Oral (01/07 0805) BP: 150/66 (01/07 0805) Pulse Rate: 79 (01/07 0805) Intake/Output from previous day: 01/06 0701 - 01/07 0700 In: 2440 [P.O.:1740; I.V.:450; IV Piggyback:250] Out: -  Intake/Output from this shift: No intake/output data recorded.  Labs:  Recent Labs  02/16/16 2124 02/17/16 0500 02/18/16 0727  WBC 9.6 8.2  --   HGB 13.3 12.5  --   PLT 172 151  --   CREATININE 2.23* 1.83* 1.89*   Estimated Creatinine Clearance: 18.1 mL/min (by C-G formula based on SCr of 1.89 mg/dL (H)). No results for input(s): VANCOTROUGH, VANCOPEAK, VANCORANDOM, GENTTROUGH, GENTPEAK, GENTRANDOM, TOBRATROUGH, TOBRAPEAK, TOBRARND, AMIKACINPEAK, AMIKACINTROU, AMIKACIN in the last 72 hours.   Microbiology: Recent Results (from the past 720 hour(s))  Urine culture     Status: Abnormal (Preliminary result)   Collection Time: 02/16/16  9:27 PM  Result Value Ref Range Status   Specimen Description URINE, RANDOM  Final   Special Requests NONE  Final   Culture >=100,000 COLONIES/mL GRAM NEGATIVE RODS (A)  Final   Report Status PENDING  Incomplete  Culture, blood (routine x 2)     Status: None (Preliminary result)   Collection Time: 02/16/16  9:50 PM  Result Value Ref Range Status   Specimen Description BLOOD LEFT ANTECUBITAL  Final   Special Requests BOTTLES DRAWN AEROBIC AND ANAEROBIC 6CCAERO,6CCANA  Final   Culture  Setup Time   Final    Organism ID to follow GRAM POSITIVE COCCI ANAEROBIC  BOTTLE ONLY CRITICAL RESULT CALLED TO, READ BACK BY AND VERIFIED WITH: NATE COOKSON AT 9604 02/18/16.[facility] CONFIRMED BY [facility]    Culture GRAM POSITIVE COCCI  Final   Report Status PENDING  Incomplete  Blood Culture ID Panel (Reflexed)     Status: Abnormal   Collection Time: 02/16/16  9:50 PM  Result Value Ref Range Status   Enterococcus species NOT DETECTED NOT DETECTED Final   Listeria monocytogenes NOT DETECTED NOT DETECTED Final   Staphylococcus species DETECTED (A) NOT DETECTED Final    Comment: CRITICAL RESULT CALLED TO, READ BACK BY AND VERIFIED WITH: NATE COOKSON AT 5409 02/18/16.[facility]    Staphylococcus aureus NOT DETECTED NOT DETECTED Final   Methicillin resistance DETECTED (A) NOT DETECTED Final    Comment: CRITICAL RESULT CALLED TO, READ BACK BY AND VERIFIED WITH: NATE COOKSON AT 8119 02/18/16.PM[facility]    Streptococcus species NOT DETECTED NOT DETECTED Final   Streptococcus agalactiae NOT DETECTED NOT DETECTED Final   Streptococcus pneumoniae NOT DETECTED NOT DETECTED Final   Streptococcus pyogenes NOT DETECTED NOT DETECTED Final   Acinetobacter baumannii NOT DETECTED NOT DETECTED Final   Enterobacteriaceae species NOT DETECTED NOT DETECTED Final   Enterobacter cloacae complex NOT DETECTED NOT DETECTED Final   Escherichia coli NOT DETECTED NOT DETECTED Final   Klebsiella oxytoca NOT DETECTED NOT DETECTED Final   Klebsiella pneumoniae NOT DETECTED NOT DETECTED Final   Proteus species NOT DETECTED NOT DETECTED Final   Serratia marcescens NOT DETECTED NOT DETECTED Final   Haemophilus influenzae NOT DETECTED NOT  DETECTED Final   Neisseria meningitidis NOT DETECTED NOT DETECTED Final   Pseudomonas aeruginosa NOT DETECTED NOT DETECTED Final   Candida albicans NOT DETECTED NOT DETECTED Final   Candida glabrata NOT DETECTED NOT DETECTED Final   Candida krusei NOT DETECTED NOT DETECTED Final   Candida parapsilosis NOT DETECTED NOT DETECTED Final   Candida tropicalis NOT DETECTED NOT DETECTED  Final  Rapid Influenza A&B Antigens ([facility] only)     Status: None   Collection Time: 02/16/16 10:34 PM  Result Value Ref Range Status   Influenza A ([facility]) NEGATIVE NEGATIVE Final   Influenza B ([facility]) NEGATIVE NEGATIVE Final  Culture, blood (routine x 2)     Status: None (Preliminary result)   Collection Time: 02/16/16 10:34 PM  Result Value Ref Range Status   Specimen Description BLOOD LEFT ANTECUBITAL  Final   Special Requests BOTTLES DRAWN AEROBIC AND ANAEROBIC 6CCAERO,7CCANA  Final   Culture NO GROWTH 2 DAYS  Final   Report Status PENDING  Incomplete  MRSA PCR Screening     Status: Abnormal   Collection Time: 02/18/16  3:38 AM  Result Value Ref Range Status   MRSA by PCR POSITIVE (A) NEGATIVE Final    Comment:        The GeneXpert MRSA Assay (FDA approved for NASAL specimens only), is one component of a comprehensive MRSA colonization surveillance program. It is not intended to diagnose MRSA infection nor to guide or monitor treatment for MRSA infections. CRITICAL RESULT CALLED TO, READ BACK BY AND VERIFIED WITH: JACKIE DOUGLAS AT 0506 ON 02/18/16 [facility].   Culture, expectorated sputum-assessment     Status: None   Collection Time: 02/18/16  8:56 AM  Result Value Ref Range Status   Specimen Description SPU  Final   Special Requests NONE  Final   Sputum evaluation THIS SPECIMEN IS ACCEPTABLE FOR SPUTUM CULTURE  Final   Report Status 02/18/2016 FINAL  Final    Medical History: Past Medical History:  Diagnosis Date  . CKD (chronic kidney disease)   . Dementia   . Diabetes mellitus without complication (HCC)   . Hypertension     Assessment: 83 y/o F with a h/o CKD admitted with dehydration and UTI on ceftriaxone. Vancomycin added due to CNS in blood culture which may represent contamination. Ceftriaxone changed to meropenem.   Goal of Therapy:  Vancomycin trough level 15-20 mcg/ml  Plan:  Vancomycin 1000 mg iv once then 750 mg iv q 36 hours. Will f/u culture results  and check levels/adjust dosing as clinically indicated.   Meropenem 500 mg iv q 12 hours.   Heather Curtis, Chryl Holten D 02/18/2016,10:07 AM

## 2016-02-19 LAB — GLUCOSE, CAPILLARY
GLUCOSE-CAPILLARY: 100 mg/dL — AB (ref 65–99)
GLUCOSE-CAPILLARY: 165 mg/dL — AB (ref 65–99)
GLUCOSE-CAPILLARY: 191 mg/dL — AB (ref 65–99)
GLUCOSE-CAPILLARY: 124 mg/dL — AB (ref 65–99)

## 2016-02-19 LAB — URINE CULTURE: Culture: >=100000 — AB

## 2016-02-19 LAB — ECHOCARDIOGRAM COMPLETE
HEIGHTINCHES: 61 in
WEIGHTICAEL: 2672 [oz_av]

## 2016-02-19 MED ORDER — CEFPODOXIME PROXETIL 200 MG PO TABS
100.0000 mg | ORAL_TABLET | Freq: Every day | ORAL | Status: DC
  Administered 2016-02-19 – 2016-02-21 (×3): 100 mg via ORAL
  Filled 2016-02-19 (×3): qty 1

## 2016-02-19 NOTE — Care Management Important Message (Signed)
Important Message  Patient Details  Name: Heather SingerHilda C Burress MRN: [id number] Date of Birth: 02/03/1923   Medicare Important Message Given:  Yes    Marily MemosLisa M Lake Cinquemani, RN 02/19/2016, 10:49 AM

## 2016-02-19 NOTE — Progress Notes (Addendum)
[facility] -  at [facility]   PATIENT NAME: Heather Curtis    MR#:  [id number]  DATE OF BIRTH:  06-26-1922  SUBJECTIVE:  CHIEF COMPLAINT:   Chief Complaint  Patient presents with  . Weakness   The patient has 83 year old female with past medical history significant for history of dementia, diabetes, hypertension, CAD, who presents to the hospital with complaints of poor oral intake, fall, weakness. She was admitted due to concerns of dehydration, initiated on IV fluids. Her creatinine has improved to baseline. Oral intake is normal. Her labs revealed mild elevation of troponin, pyuria, concerning for urinary tract infection. The patient was initiated on Rocephin. She feels good today, denies any pain or discomfort. Urine culture revealed >=100,000 COLONIES/mL CITROBACTER FREUNDII , >=100,000 COLONIES/mL STREPTOCOCCUS AGALACTIAE, sputum cultures are pending, blood culture revealed Coagulase negative Staphylococcus, not MRSA in one blood culture out of 2, repeated blood cultures and negative so far,, now on vancomycin and Zosyn. Echocardiogram was unremarkable, but grade 1 diastolic dysfunction, normal ejection fraction. Patient feels comfortable, no pain. She is afebrile for the past 24 hours. White blood cell count is normal.    Review of Systems  Unable to perform ROS: Dementia    VITAL SIGNS: Blood pressure 114/60, pulse 88, temperature 98.8 F (37.1 C), temperature source Oral, resp. rate 18, height 5\' 1"  (1.549 m), weight 75.8 kg (167 lb), SpO2 97 %.  PHYSICAL EXAMINATION:   GENERAL:  83 y.o.-year-old patient lying in the bed with no acute distress. Somnolent, arousable, able to communicate EYES: Pupils equal, round, reactive to light and accommodation. No scleral icterus. Extraocular muscles intact.  HEENT: Head atraumatic, normocephalic. Oropharynx and nasopharynx clear.  NECK:  Supple, no jugular venous distention. No thyroid enlargement, no  tenderness.  LUNGS: Some diminished breath sounds bilaterally, no wheezing, rales,rhonchi or crepitation. No use of accessory muscles of respiration.  CARDIOVASCULAR: S1, S2 normal. No murmurs, rubs, or gallops.  ABDOMEN: Soft, nontender, nondistended. Bowel sounds present. No organomegaly or mass.  EXTREMITIES: No pedal edema, cyanosis, or clubbing.  NEUROLOGIC: Cranial nerves II through XII are intact. Muscle strength 5/5 in all extremities. Sensation intact. Gait not checked.  PSYCHIATRIC: The patient is somnolent, not oriented  SKIN: No obvious rash, lesion, or ulcer.   ORDERS/RESULTS REVIEWED:   CBC  Recent Labs Lab 02/16/16 2124 02/17/16 0500  WBC 9.6 8.2  HGB 13.3 12.5  HCT 39.8 37.3  PLT 172 151  MCV 88.9 89.1  MCH 29.8 29.8  MCHC 33.5 33.4  RDW 14.5 14.3   ------------------------------------------------------------------------------------------------------------------  Chemistries   Recent Labs Lab 02/16/16 2124 02/17/16 0500 02/18/16 0727  NA 138 141 134*  K 4.6 3.9 4.0  CL 108 112* 105  CO2 23 22 22   GLUCOSE 217* 96 82  BUN 51* 42* 41*  CREATININE 2.23* 1.83* 1.89*  CALCIUM 8.9 8.6* 8.2*   ------------------------------------------------------------------------------------------------------------------ estimated creatinine clearance is 18.1 mL/min (by C-G formula based on SCr of 1.89 mg/dL (H)). ------------------------------------------------------------------------------------------------------------------ No results for input(s): TSH, T4TOTAL, T3FREE, THYROIDAB in the last 72 hours.  Invalid input(s): FREET3  Cardiac Enzymes  Recent Labs Lab 02/16/16 2124  TROPONINI 0.03*   ------------------------------------------------------------------------------------------------------------------ Invalid input(s): POCBNP ---------------------------------------------------------------------------------------------------------------  RADIOLOGY: No  results found.  EKG:  Orders placed or performed during the hospital encounter of 02/16/16  . ED EKG  . ED EKG  . EKG 12-Lead  . EKG 12-Lead  . EKG 12-Lead  . EKG 12-Lead    ASSESSMENT AND PLAN:  Active  Problems:   Dehydration   Bacteremia  #1. Urinary tract infection due to Citrobacter and Streptococcus, change Zosyn to Third-generation oral cephalosporin #2. Coagulase negative Staphylococcus, unlikely MRSA bacteremia, likely contamination, repeated blood cultures are negative so far, unremarkable echocardiogram with normal ejection fraction, no valvular abnormalities, grade 1 diastolic dysfunction, ID consultation is pending, sputum culture is pending. Continue vancomycin for now intravenously  #3 metabolic encephalopathy, improved clinically #4. Generalized weakness, physical therapist. Consultation is appreciated, skilled nursing facility versus home health physical therapy with 24 hour supervision/assistance was recommended, care Management is involved for discharge planning #5. Acute on chronic renal insufficiency, improved to baseline, now off IV fluids, oral intake was not documented, but nurse assured me that the patient eats and drinks well. #6. Elevated troponin, echocardiogram was unremarkable, suspect demand ischemia, supportive care, added nitroglycerin topically #7. MRSA carrier, continue mupirocin Management plans discussed with the patient, family and they are in agreement.   DRUG ALLERGIES:  Allergies  Allergen Reactions  . Ace Inhibitors Cough  . Ciprofloxacin Other (See Comments)  . Other Other (See Comments)  . Penicillins Rash  . Sulfa Antibiotics Itching, Rash and Other (See Comments)    CODE STATUS:     Code Status Orders        Start     Ordered   02/17/16 0458  Do not attempt resuscitation (DNR)  Continuous    Question Answer Comment  In the event of cardiac or respiratory ARREST Do not call a "code blue"   In the event of cardiac or  respiratory ARREST Do not perform Intubation, CPR, defibrillation or ACLS   In the event of cardiac or respiratory ARREST Use medication by any route, position, wound care, and other measures to relive pain and suffering. May use oxygen, suction and manual treatment of airway obstruction as needed for comfort.      02/17/16 0458    Code Status History    Date Active Date Inactive Code Status Order ID Comments User Context   This patient has a current code status but no historical code status.    Advance Directive Documentation   Flowsheet Row Most Recent Value  Type of Advance Directive  Out of facility DNR (pink MOST or yellow form)  Pre-existing out of facility DNR order (yellow form or pink MOST form)  No data  "MOST" Form in Place?  No data      TOTAL TIME TAKING CARE OF THIS PATIENT: 25 minutes.    Katharina CaperVAICKUTE,Gizell Danser M.D on 02/19/2016 at 2:42 PM  Between 7am to 6pm - Pager - [phone]  After 6pm go to [email or web address] - password [email or web address]  [facility]  Office  [phone]  CC: Primary care physician; Pcp Not In System

## 2016-02-19 NOTE — Consult Note (Signed)
[facility] Infectious Disease     Reason for Consult: bacteremia   Referring Physician: Seth Bake Date of Admission:  02/16/2016   Active Problems:   Dehydration   Bacteremia   HPI: Heather Curtis is a 83 y.o. female admitted with decrease po intake and a fall.  She has a  history of Dementia, diabetes mellitus type 2, hypertension, chronic kidney disease. On admit wbc 9, low grade fevers, UA with TNTC wbc and bcx + coag neg staph.  CXR neg for infiltrate. UCX with > 100 K citrobacter and Gb Strep.   Her caregiver is at bedside - states she is prone to UTIs, she wears depends, most of time can have BM in toilet with assistance, does get up and go around with walker. Has had some cough.  Past Medical History:  Diagnosis Date  . CKD (chronic kidney disease)   . Dementia   . Diabetes mellitus without complication ([address])   . Hypertension    Past Surgical History:  Procedure Laterality Date  . ABDOMINAL HYSTERECTOMY    . CHOLECYSTECTOMY     Social History  Substance Use Topics  . Smoking status: Never Smoker  . Smokeless tobacco: Never Used  . Alcohol use No   History reviewed. No pertinent family history.  Allergies:  Allergies  Allergen Reactions  . Ace Inhibitors Cough  . Ciprofloxacin Other (See Comments)  . Other Other (See Comments)  . Penicillins Rash  . Sulfa Antibiotics Itching, Rash and Other (See Comments)    Current antibiotics: Antibiotics Given (last 72 hours)    Date/Time Action Medication Dose Rate   02/16/16 2242 Given   cefTRIAXone (ROCEPHIN) IVPB 1 g 1 g 100 mL/hr   02/18/16 0353 Given   cefTRIAXone (ROCEPHIN) IVPB 1 g 1 g 100 mL/hr   02/18/16 0426 Given   vancomycin (VANCOCIN) IVPB 1000 mg/200 mL premix 1,000 mg 200 mL/hr   02/18/16 1711 Given   meropenem (MERREM) 500 mg in sodium chloride 0.9 % 50 mL IVPB 500 mg 100 mL/hr   02/18/16 2134 Given   meropenem (MERREM) 500 mg in sodium chloride 0.9 % 50 mL IVPB 500 mg 100 mL/hr   02/18/16 2357  Given   vancomycin (VANCOCIN) IVPB 750 mg/150 ml premix 750 mg 150 mL/hr   02/19/16 0823 Given   meropenem (MERREM) 500 mg in sodium chloride 0.9 % 50 mL IVPB 500 mg 100 mL/hr      MEDICATIONS: . cefpodoxime  100 mg Oral Daily  . donepezil  10 mg Oral QHS  . heparin subcutaneous  5,000 Units Subcutaneous Q8H  . insulin aspart  0-5 Units Subcutaneous QHS  . insulin aspart  0-9 Units Subcutaneous TID WC  . insulin aspart  3 Units Subcutaneous TID WC  . losartan  50 mg Oral Daily  . mupirocin ointment   Nasal BID  . nitroGLYCERIN  1 inch Topical Q6H  . pravastatin  40 mg Oral Daily  . vancomycin  750 mg Intravenous Q36H    Review of Systems - 11 systems reviewed and negative per HPI   OBJECTIVE: Temp:  [98.3 F (36.8 C)-98.9 F (37.2 C)] 98.8 F (37.1 C) (01/08 1035) Pulse Rate:  [75-88] 88 (01/08 1035) Resp:  [18-19] 18 (01/08 1035) BP: (110-158)/(44-76) 114/60 (01/08 1035) SpO2:  [96 %-99 %] 97 % (01/08 1035) Physical Exam  Constitutional:  Frail, lying in bed, but arousable HENT: [place]/AT, PERRLA, no scleral icterus Mouth/Throat: Oropharynx is clear and moist. No oropharyngeal exudate.  Cardiovascular: Normal rate, regular rhythm and normal heart sounds. Pulmonary/Chest: bil rhonchi Neck = supple, no nuchal rigidity Abdominal: Soft. Bowel sounds are normal.  exhibits no distension. There is no tenderness.  Lymphadenopathy: no cervical adenopathy. No axillary adenopathy Neurological: alert and conversant Skin: Skin is warm and dry. No rash noted. No erythema.  Psychiatric: a normal mood and affect.  behavior is normal.    LABS: Results for orders placed or performed during the hospital encounter of 02/16/16 (from the past 48 hour(s))  Glucose, capillary     Status: Abnormal   Collection Time: 02/17/16  4:10 PM  Result Value Ref Range   Glucose-Capillary 51 (L) 65 - 99 mg/dL   Comment 1 Notify RN   Glucose, capillary     Status: None   Collection Time: 02/17/16  5:10  PM  Result Value Ref Range   Glucose-Capillary 84 65 - 99 mg/dL  Glucose, capillary     Status: Abnormal   Collection Time: 02/17/16 10:11 PM  Result Value Ref Range   Glucose-Capillary 57 (L) 65 - 99 mg/dL   Comment 1 Notify RN   Glucose, capillary     Status: None   Collection Time: 02/17/16 11:16 PM  Result Value Ref Range   Glucose-Capillary 87 65 - 99 mg/dL   Comment 1 Document in Chart   MRSA PCR Screening     Status: Abnormal   Collection Time: 02/18/16  3:38 AM  Result Value Ref Range   MRSA by PCR POSITIVE (A) NEGATIVE    Comment:        The GeneXpert MRSA Assay (FDA approved for NASAL specimens only), is one component of a comprehensive MRSA colonization surveillance program. It is not intended to diagnose MRSA infection nor to guide or monitor treatment for MRSA infections. CRITICAL RESULT CALLED TO, READ BACK BY AND VERIFIED WITH: JACKIE DOUGLAS AT 0506 ON 02/18/16 [place].   Basic metabolic panel     Status: Abnormal   Collection Time: 02/18/16  7:27 AM  Result Value Ref Range   Sodium 134 (L) 135 - 145 mmol/L   Potassium 4.0 3.5 - 5.1 mmol/L   Chloride 105 101 - 111 mmol/L   CO2 22 22 - 32 mmol/L   Glucose, Bld 82 65 - 99 mg/dL   BUN 41 (H) 6 - 20 mg/dL   Creatinine, Ser 1.89 (H) 0.44 - 1.00 mg/dL   Calcium 8.2 (L) 8.9 - 10.3 mg/dL   GFR calc non Af Amer 22 (L) >60 mL/min   GFR calc Af Amer 26 (L) >60 mL/min    Comment: (NOTE) The eGFR has been calculated using the CKD EPI equation. This calculation has not been validated in all clinical situations. eGFR's persistently <60 mL/min signify possible Chronic Kidney Disease.    Anion gap 7 5 - 15  Glucose, capillary     Status: None   Collection Time: 02/18/16  8:09 AM  Result Value Ref Range   Glucose-Capillary 72 65 - 99 mg/dL   Comment 1 Notify RN   CULTURE, BLOOD (ROUTINE X 2) w Reflex to ID Panel     Status: None (Preliminary result)   Collection Time: 02/18/16  8:18 AM  Result Value Ref Range    Specimen Description BLOOD LEFT HAND    Special Requests BOTTLES DRAWN AEROBIC AND ANAEROBIC [id number]    Culture NO GROWTH < 24 HOURS    Report Status PENDING   CULTURE, BLOOD (ROUTINE X 2) w Reflex to ID Panel  Status: None (Preliminary result)   Collection Time: 02/18/16  8:32 AM  Result Value Ref Range   Specimen Description BLOOD RIGHT HAND    Special Requests      BOTTLES DRAWN AEROBIC AND ANAEROBIC ANA2ML AER 4 ML   Culture NO GROWTH < 24 HOURS    Report Status PENDING   Culture, expectorated sputum-assessment     Status: None   Collection Time: 02/18/16  8:56 AM  Result Value Ref Range   Specimen Description SPU    Special Requests NONE    Sputum evaluation THIS SPECIMEN IS ACCEPTABLE FOR SPUTUM CULTURE    Report Status 02/18/2016 FINAL   Culture, respiratory (NON-Expectorated)     Status: None (Preliminary result)   Collection Time: 02/18/16  8:56 AM  Result Value Ref Range   Specimen Description SPU    Special Requests NONE Reflexed from [id number]    Gram Stain      FEW WBC PRESENT, PREDOMINANTLY MONONUCLEAR FEW SQUAMOUS EPITHELIAL CELLS PRESENT MODERATE GRAM NEGATIVE COCCOBACILLI ABUNDANT GRAM POSITIVE COCCI IN PAIRS IN CLUSTERS RARE GRAM POSITIVE RODS    Culture      CULTURE REINCUBATED FOR BETTER GROWTH Performed at [facility]    Report Status PENDING   Glucose, capillary     Status: Abnormal   Collection Time: 02/18/16 11:32 AM  Result Value Ref Range   Glucose-Capillary 120 (H) 65 - 99 mg/dL   Comment 1 Notify RN   Glucose, capillary     Status: Abnormal   Collection Time: 02/18/16  4:39 PM  Result Value Ref Range   Glucose-Capillary 121 (H) 65 - 99 mg/dL   Comment 1 Notify RN    Comment 2 Document in Chart   Glucose, capillary     Status: Abnormal   Collection Time: 02/18/16  9:06 PM  Result Value Ref Range   Glucose-Capillary 136 (H) 65 - 99 mg/dL   Comment 1 Notify RN   Glucose, capillary     Status: Abnormal   Collection Time:  02/19/16  7:25 AM  Result Value Ref Range   Glucose-Capillary 100 (H) 65 - 99 mg/dL  Glucose, capillary     Status: Abnormal   Collection Time: 02/19/16 11:43 AM  Result Value Ref Range   Glucose-Capillary 165 (H) 65 - 99 mg/dL   Comment 1 Notify RN    No components found for: ESR, C REACTIVE PROTEIN MICRO: Recent Results (from the past 720 hour(s))  Urine culture     Status: Abnormal   Collection Time: 02/16/16  9:27 PM  Result Value Ref Range Status   Specimen Description URINE, RANDOM  Final   Special Requests NONE  Final   Culture (A)  Final    >=100,000 COLONIES/mL CITROBACTER FREUNDII >=100,000 COLONIES/mL GROUP B STREP(S.AGALACTIAE)ISOLATED TESTING AGAINST S. AGALACTIAE NOT ROUTINELY PERFORMED DUE TO PREDICTABILITY OF AMP/PEN/VAN SUSCEPTIBILITY. Performed at [facility]    Report Status 02/19/2016 FINAL  Final   Organism ID, Bacteria CITROBACTER FREUNDII (A)  Final      Susceptibility   Citrobacter freundii - MIC*    CEFAZOLIN >=64 RESISTANT Resistant     CEFTRIAXONE <=1 SENSITIVE Sensitive     CIPROFLOXACIN <=0.25 SENSITIVE Sensitive     GENTAMICIN <=1 SENSITIVE Sensitive     IMIPENEM 2 SENSITIVE Sensitive     NITROFURANTOIN <=16 SENSITIVE Sensitive     TRIMETH/SULFA <=20 SENSITIVE Sensitive     PIP/TAZO <=4 SENSITIVE Sensitive     * >=100,000 COLONIES/mL CITROBACTER FREUNDII  Culture,  blood (routine x 2)     Status: Abnormal (Preliminary result)   Collection Time: 02/16/16  9:50 PM  Result Value Ref Range Status   Specimen Description BLOOD LEFT ANTECUBITAL  Final   Special Requests BOTTLES DRAWN AEROBIC AND ANAEROBIC 6CCAERO,6CCANA  Final   Culture  Setup Time   Final    Organism ID to follow GRAM POSITIVE COCCI ANAEROBIC BOTTLE ONLY CRITICAL RESULT CALLED TO, READ BACK BY AND VERIFIED WITH: NATE COOKSON AT 7341 02/18/16.[facility]H CONFIRMED BY [facility]    Culture STAPHYLOCOCCUS SPECIES (COAGULASE NEGATIVE) (A)  Final   Report Status PENDING  Incomplete  Blood  Culture ID Panel (Reflexed)     Status: Abnormal   Collection Time: 02/16/16  9:50 PM  Result Value Ref Range Status   Enterococcus species NOT DETECTED NOT DETECTED Final   Listeria monocytogenes NOT DETECTED NOT DETECTED Final   Staphylococcus species DETECTED (A) NOT DETECTED Final    Comment: CRITICAL RESULT CALLED TO, READ BACK BY AND VERIFIED WITH: NATE COOKSON AT 9379 02/18/16.PMH    Staphylococcus aureus NOT DETECTED NOT DETECTED Final   Methicillin resistance DETECTED (A) NOT DETECTED Final    Comment: CRITICAL RESULT CALLED TO, READ BACK BY AND VERIFIED WITH: NATE COOKSON AT 0240 02/18/16.PMH    Streptococcus species NOT DETECTED NOT DETECTED Final   Streptococcus agalactiae NOT DETECTED NOT DETECTED Final   Streptococcus pneumoniae NOT DETECTED NOT DETECTED Final   Streptococcus pyogenes NOT DETECTED NOT DETECTED Final   Acinetobacter baumannii NOT DETECTED NOT DETECTED Final   Enterobacteriaceae species NOT DETECTED NOT DETECTED Final   Enterobacter cloacae complex NOT DETECTED NOT DETECTED Final   Escherichia coli NOT DETECTED NOT DETECTED Final   Klebsiella oxytoca NOT DETECTED NOT DETECTED Final   Klebsiella pneumoniae NOT DETECTED NOT DETECTED Final   Proteus species NOT DETECTED NOT DETECTED Final   Serratia marcescens NOT DETECTED NOT DETECTED Final   Haemophilus influenzae NOT DETECTED NOT DETECTED Final   Neisseria meningitidis NOT DETECTED NOT DETECTED Final   Pseudomonas aeruginosa NOT DETECTED NOT DETECTED Final   Candida albicans NOT DETECTED NOT DETECTED Final   Candida glabrata NOT DETECTED NOT DETECTED Final   Candida krusei NOT DETECTED NOT DETECTED Final   Candida parapsilosis NOT DETECTED NOT DETECTED Final   Candida tropicalis NOT DETECTED NOT DETECTED Final  Rapid Influenza A&B Antigens ([facility] only)     Status: None   Collection Time: 02/16/16 10:34 PM  Result Value Ref Range Status   Influenza A ([facility]) NEGATIVE NEGATIVE Final   Influenza B ([facility])  NEGATIVE NEGATIVE Final  Culture, blood (routine x 2)     Status: None (Preliminary result)   Collection Time: 02/16/16 10:34 PM  Result Value Ref Range Status   Specimen Description BLOOD LEFT ANTECUBITAL  Final   Special Requests BOTTLES DRAWN AEROBIC AND ANAEROBIC 6CCAERO,7CCANA  Final   Culture NO GROWTH 3 DAYS  Final   Report Status PENDING  Incomplete  MRSA PCR Screening     Status: Abnormal   Collection Time: 02/18/16  3:38 AM  Result Value Ref Range Status   MRSA by PCR POSITIVE (A) NEGATIVE Final    Comment:        The GeneXpert MRSA Assay (FDA approved for NASAL specimens only), is one component of a comprehensive MRSA colonization surveillance program. It is not intended to diagnose MRSA infection nor to guide or monitor treatment for MRSA infections. CRITICAL RESULT CALLED TO, READ BACK BY AND VERIFIED WITH: Kennyth Lose DOUGLAS AT [phone]  ON 02/18/16 [place].   CULTURE, BLOOD (ROUTINE X 2) w Reflex to ID Panel     Status: None (Preliminary result)   Collection Time: 02/18/16  8:18 AM  Result Value Ref Range Status   Specimen Description BLOOD LEFT HAND  Final   Special Requests BOTTLES DRAWN AEROBIC AND ANAEROBIC [id number]  Final   Culture NO GROWTH < 24 HOURS  Final   Report Status PENDING  Incomplete  CULTURE, BLOOD (ROUTINE X 2) w Reflex to ID Panel     Status: None (Preliminary result)   Collection Time: 02/18/16  8:32 AM  Result Value Ref Range Status   Specimen Description BLOOD RIGHT HAND  Final   Special Requests   Final    BOTTLES DRAWN AEROBIC AND ANAEROBIC ANA2ML AER 4 ML   Culture NO GROWTH < 24 HOURS  Final   Report Status PENDING  Incomplete  Culture, expectorated sputum-assessment     Status: None   Collection Time: 02/18/16  8:56 AM  Result Value Ref Range Status   Specimen Description SPU  Final   Special Requests NONE  Final   Sputum evaluation THIS SPECIMEN IS ACCEPTABLE FOR SPUTUM CULTURE  Final   Report Status 02/18/2016 FINAL  Final  Culture,  respiratory (NON-Expectorated)     Status: None (Preliminary result)   Collection Time: 02/18/16  8:56 AM  Result Value Ref Range Status   Specimen Description SPU  Final   Special Requests NONE Reflexed from [id number]  Final   Gram Stain   Final    FEW WBC PRESENT, PREDOMINANTLY MONONUCLEAR FEW SQUAMOUS EPITHELIAL CELLS PRESENT MODERATE GRAM NEGATIVE COCCOBACILLI ABUNDANT GRAM POSITIVE COCCI IN PAIRS IN CLUSTERS RARE GRAM POSITIVE RODS    Culture   Final    CULTURE REINCUBATED FOR BETTER GROWTH Performed at [facility]    Report Status PENDING  Incomplete    IMAGING: Dg Chest Port 1 View  Result Date: 02/16/2016 CLINICAL DATA:  Initial evaluation for generalized weakness, cough. EXAM: PORTABLE CHEST 1 VIEW COMPARISON:  Prior radiograph from 06/13/2014. FINDINGS: Stable cardiomegaly. Mediastinal silhouette within normal limits. Aortic atherosclerosis noted. Lungs are hypoinflated. Diffuse vascular congestion with interstitial prominence, suggesting mild edema. No definite pleural effusion. No focal infiltrates identified. No pneumothorax. Surgical clips overlie the right axilla. No acute osseous abnormality. IMPRESSION: 1. Cardiomegaly with diffuse pulmonary vascular congestion and interstitial prominence, suggesting mild diffuse edema. 2. Aortic atherosclerosis. Electronically Signed   By: Jeannine Boga M.D.   On: 02/16/2016 22:56   Assessment:   DIMITRA WOODSTOCK is a 83 y.o. female with dementia admitted with decrease po intake, mild fever and found to have UA TNTC and bcx 1/2 with Coag neg staph. She does not have MRSA bacteremia but rather Coag neg staph which is also Methicillin resistant.  I think her main issue is her UTI  Recommendations Dc vanco and meropenem Can change to cefpodoxime or cipro for the UTI for a 7 day course Sputum cx is pending  Discussed with caregiver trying to have her void in toilet and use depends only for accidents, continuing to encourage fluid  intake.   Thank you very much for allowing me to participate in the care of this patient. Please call with questions.   Cheral Marker. Ola Spurr, MD

## 2016-02-19 NOTE — Progress Notes (Signed)
Referral for hospice of [place] services at home received over the week end [facility]. Chart notes reviewed, current plan is unclear.Writer awaiting discharge disposition as physical therapy has recommended SNF or home with home health PT.  CSW Fredric MareBailey and [facility] Misty StanleyLisa aware of referral. Thank you. Dayna BarkerKaren Robertson Rn, BSN, [facility] [place], hospital liaison [phone] c

## 2016-02-19 NOTE — Clinical Social Work Note (Signed)
Clinical Social Work Assessment  Patient Details  Name: Heather Curtis MRN: [id number] Date of Birth: 1922/11/13  Date of referral:  02/19/16               Reason for consult:  Facility Placement                Permission sought to share information with:  Oceanographeracility Contact Representative Permission grWilson Singeranted to share information::  Yes, Verbal Permission Granted  Name::      Skilled Nursing Facility   Agency::   [place]   Relationship::     Contact Information:     Housing/Transportation Living arrangements for the past 2 months:  Single Family Home Source of Information:  Adult Children Patient Interpreter Needed:  None Criminal Activity/Legal Involvement Pertinent to Current Situation/Hospitalization:  No - Comment as needed Significant Relationships:  Adult Children Lives with:  Adult Children Do you feel safe going back to the place where you live?  Yes Need for family participation in patient care:  Yes (Comment)  Care giving concerns:  [place] with [place] daughter Heather Curtis.    Social Worker assessment / plan:  Visual merchandiserClinical Social Worker (CSW) reviewed chart and noted that PT is recommending SNF VS. Home Health 24/7 supervision. Per chart patient's daughters are interested in hospice services. Per chart patient is not alert and oriented. CSW contacted patient's daughter Heather Curtis to complete assessment. Per Heather Curtis she lives with patient in [place]. Patient has private sitters at home that care for her. CSW explained that PT is recommending SNF or home with 24/7 care. Per daughter patient will be on PO antibiotics and they prefer to take patient home with hospice. Daughter is agreeable to SNF search as a back up plan. CSW explained that [facility]ia [facility] will have to approve SNF. FL2 complete and faxed out.   CSW presented bed offers to [facility] and she chose [facility]. [facility] liaison is aware of accepted bed offer and will start [employer] authorization today. Per daughter she is leaning towards bringing  patient home with hospice and is deciding between [facility] and [facility]. Per Lea she will discuss discharge options with her siblings and get back to CSW tomorrow. CSW will continue to follow and assist as needed.    Employment status:  Retired Database administratornsurance information:  Managed Medicare PT Recommendations:  Skilled Nursing Facility Information / Referral to community resources:  Skilled Nursing Facility  Patient/Family's Response to care:  Daughter Heather Curtis is going to discuss SNF or home with hospice options with her family.   Patient/Family's Understanding of and Emotional Response to Diagnosis, Current Treatment, and Prognosis:  Daughter was very pleasant and thanked CSW for assistance.   Emotional Assessment Appearance:  Appears stated age Attitude/Demeanor/Rapport:    Affect (typically observed):  Unable to Assess Orientation:  Oriented to Self, Fluctuating Orientation (Suspected and/or reported Sundowners) Alcohol / Substance use:  Not Applicable Psych involvement (Current and /or in the community):  No (Comment)  Discharge Needs  Concerns to be addressed:  Discharge Planning Concerns Readmission within the last 30 days:  No Current discharge risk:  Dependent with Mobility Barriers to Discharge:  Continued Medical Work up   [employer], Heather CrockerBailey M, LCSW 02/19/2016, 5:40 PM

## 2016-02-19 NOTE — Clinical Social Work Placement (Signed)
   CLINICAL SOCIAL WORK PLACEMENT  NOTE  Date:  02/19/2016  Patient Details  Name: Heather SingerHilda C Savoia MRN: [id number] Date of Birth: Oct 30, 1922  Clinical Social Work is seeking post-discharge placement for this patient at the Skilled  Nursing Facility level of care (*CSW will initial, date and re-position this form in  chart as items are completed):  Yes   Patient/family provided with [place] Clinical Social Work Department's list of facilities offering this level of care within the geographic area requested by the patient (or if unable, by the patient's family).  Yes   Patient/family informed of their freedom to choose among providers that offer the needed level of care, that participate in Medicare, Medicaid or managed care program needed by the patient, have an available bed and are willing to accept the patient.  Yes   Patient/family informed of [facility]'s ownership interest in [facility] and [facility], as well as of the fact that they are under no obligation to receive care at these facilities.  PASRR submitted to EDS on 02/19/16     PASRR number received on 02/19/16     Existing PASRR number confirmed on       FL2 transmitted to all facilities in geographic area requested by pt/family on 02/19/16     FL2 transmitted to all facilities within larger geographic area on       Patient informed that his/her managed care company has contracts with or will negotiate with certain facilities, including the following:        Yes   Patient/family informed of bed offers received.  Patient chooses bed at  ([facility] )     Physician recommends and patient chooses bed at      Patient to be transferred to   on  .  Patient to be transferred to facility by       Patient family notified on   of transfer.  Name of family member notified:        PHYSICIAN       Additional Comment:    _______________________________________________ Indigo Chaddock, Darleen CrockerBailey M, LCSW 02/19/2016, 5:40 PM

## 2016-02-19 NOTE — Progress Notes (Signed)
Patient resting in bed. No complaints at this time. Continue to monitor.

## 2016-02-19 NOTE — [place] FL2 (Signed)
[place] MEDICAID FL2 LEVEL OF CARE SCREENING TOOL     IDENTIFICATION  Patient Name: Heather SingerHilda C Kea Birthdate: 10/24/1922 Sex: female Admission Date (Current Location): 02/16/2016  [place]   Facility and Address:  [facility], [address], [place], [place] [address]      Provider Number: [id number]  Attending Physician Name and Address:  Katharina Caperima Vaickute, MD  Relative Name and Phone Number:       Current Level of Care: Hospital Recommended Level of Care: Skilled Nursing Facility Prior Approval Number:    Date Approved/Denied:   PASRR Number:  [id number])  Discharge Plan: SNF    Current Diagnoses: Patient Active Problem List   Diagnosis Date Noted  . Bacteremia 02/18/2016  . Dehydration 02/17/2016    Orientation RESPIRATION BLADDER Height & Weight     Self, Place  Normal Continent Weight: 167 lb (75.8 kg) Height:  5\' 1"  (154.9 cm)  BEHAVIORAL SYMPTOMS/MOOD NEUROLOGICAL BOWEL NUTRITION STATUS   (none )  (none) Continent Diet (Diet: Carb Modified )  AMBULATORY STATUS COMMUNICATION OF NEEDS Skin   Extensive Assist Verbally Normal                       Personal Care Assistance Level of Assistance  Bathing, Feeding, Dressing Bathing Assistance: Limited assistance Feeding assistance: Independent Dressing Assistance: Limited assistance     Functional Limitations Info  Sight, Hearing, Speech Sight Info: Adequate Hearing Info: Adequate Speech Info: Adequate    SPECIAL CARE FACTORS FREQUENCY  PT (By licensed PT), OT (By licensed OT)     PT Frequency:  (5) OT Frequency:  (5)            Contractures      Additional Factors Info  Code Status, Allergies, Insulin Sliding Scale, Isolation Precautions Code Status Info:  (DNR ) Allergies Info:  (Ace Inhibitors, Ciprofloxacin, Other, Penicillins, Sulfa Antibiotics)   Insulin Sliding Scale Info:  (NovoLog Insulin Injections. )       Current  Medications (02/19/2016):  This is the current hospital active medication list Current Facility-Administered Medications  Medication Dose Route Frequency Provider Last Rate Last Dose  . 0.9 %  sodium chloride infusion   Intravenous Continuous Katharina Caperima Vaickute, MD 50 mL/hr at 02/18/16 2139    . acetaminophen (TYLENOL) tablet 650 mg  650 mg Oral Q6H PRN Ihor AustinPavan Pyreddy, MD   650 mg at 02/18/16 1718   Or  . acetaminophen (TYLENOL) suppository 650 mg  650 mg Rectal Q6H PRN Pavan Pyreddy, MD      . donepezil (ARICEPT) tablet 10 mg  10 mg Oral QHS Pavan Pyreddy, MD   10 mg at 02/18/16 2134  . haloperidol (HALDOL) tablet 0.5 mg  0.5 mg Oral Q8H PRN Ihor AustinPavan Pyreddy, MD      . heparin injection 5,000 Units  5,000 Units Subcutaneous Q8H Ihor AustinPavan Pyreddy, MD   5,000 Units at 02/19/16 0512  . insulin aspart (novoLOG) injection 0-5 Units  0-5 Units Subcutaneous QHS Katharina Caperima Vaickute, MD      . insulin aspart (novoLOG) injection 0-9 Units  0-9 Units Subcutaneous TID WC Katharina Caperima Vaickute, MD   1 Units at 02/18/16 1711  . insulin aspart (novoLOG) injection 3 Units  3 Units Subcutaneous TID WC Katharina Caperima Vaickute, MD   3 Units at 02/19/16 0816  . losartan (COZAAR) tablet 50 mg  50 mg Oral Daily Ihor AustinPavan Pyreddy, MD   50 mg at 02/19/16 0816  . meropenem (MERREM)  500 mg in sodium chloride 0.9 % 50 mL IVPB  500 mg Intravenous Q12H Valentina Gu, RPH   500 mg at 02/19/16 1610  . mupirocin ointment (BACTROBAN) 2 %   Nasal BID Katharina Caper, MD      . nitroGLYCERIN (NITROGLYN) 2 % ointment 1 inch  1 inch Topical Q6H Katharina Caper, MD   1 inch at 02/19/16 0528  . ondansetron (ZOFRAN) tablet 4 mg  4 mg Oral Q6H PRN Ihor Austin, MD       Or  . ondansetron (ZOFRAN) injection 4 mg  4 mg Intravenous Q6H PRN Pavan Pyreddy, MD      . pravastatin (PRAVACHOL) tablet 40 mg  40 mg Oral Daily Ihor Austin, MD   40 mg at 02/19/16 0816  . senna-docusate (Senokot-S) tablet 1 tablet  1 tablet Oral QHS PRN Ihor Austin, MD      . vancomycin (VANCOCIN) IVPB  750 mg/150 ml premix  750 mg Intravenous Q36H Valentina Gu, RPH   750 mg at 02/18/16 2357     Discharge Medications: Please see discharge summary for a list of discharge medications.  Relevant Imaging Results:  Relevant Lab Results:   Additional Information  (SSN: [id number])  Marciel Offenberger, Darleen Crocker, LCSW

## 2016-02-20 LAB — CREATININE, SERUM
CREATININE: 1.96 mg/dL — AB (ref 0.44–1.00)
GFR, EST AFRICAN AMERICAN: 25 mL/min — AB (ref >60)
GFR, EST NON AFRICAN AMERICAN: 21 mL/min — AB (ref >60)

## 2016-02-20 LAB — GLUCOSE, CAPILLARY
Glucose-Capillary: 139 mg/dL — ABNORMAL HIGH (ref 65–99)
Glucose-Capillary: 163 mg/dL — ABNORMAL HIGH (ref 65–99)
Glucose-Capillary: 158 mg/dL — ABNORMAL HIGH (ref 65–99)
Glucose-Capillary: 113 mg/dL — ABNORMAL HIGH (ref 65–99)

## 2016-02-20 LAB — CULTURE, RESPIRATORY W GRAM STAIN: Culture: NORMAL

## 2016-02-20 LAB — CULTURE, BLOOD (ROUTINE X 2)

## 2016-02-20 MED ORDER — SODIUM CHLORIDE 0.9 % IV SOLN: INTRAVENOUS | Status: DC

## 2016-02-20 NOTE — Progress Notes (Signed)
[facility] [address] at [facility]   PATIENT NAME: Heather CoveHilda Longstreth    MR#:  [id number]  DATE OF BIRTH:  [birth year]  SUBJECTIVE:  CHIEF COMPLAINT:   Chief Complaint  Patient presents with  . Weakness   The patient has 83 year old female with past medical history significant for history of dementia, diabetes, hypertension, CAD, who presents to the hospital with complaints of poor oral intake, fall, weakness. She was admitted due to concerns of dehydration, initiated on IV fluids. Her creatinine has improved to baseline. Oral intake is normal. Her labs revealed mild elevation of troponin, pyuria, concerning for urinary tract infection. The patient was initiated on Rocephin. She feels good today, denies any pain or discomfort. Urine culture revealed >=100,000 COLONIES/mL CITROBACTER FREUNDII , >=100,000 COLONIES/mL STREPTOCOCCUS AGALACTIAE, sputum cultures are pending, blood culture revealed Coagulase negative Staphylococcus, not MRSA in one blood culture out of 2, repeated blood cultures and negative so far,, now on vancomycin and Zosyn. Echocardiogram was unremarkable, but grade 1 diastolic dysfunction, normal ejection fraction. Patient feels comfortable, no pain. She is afebrile for the past 24 hours. White blood cell count is normal.  Patient feels good today, oral intake has improved, overall, she is comfortable, ready to go home. She is to be reevaluated by physical therapist and decision will be made about rehabilitation placement versus returning back home. ID recommended to initiate antibiotic therapy orally for urinary tract infection. Dr. Sampson GoonFitzgerald felt that a CNS and blood is contamination.   Review of Systems  Unable to perform ROS: Dementia    VITAL SIGNS: Blood pressure (!) 153/60, pulse 74, temperature 97.4 F (36.3 C), temperature source Oral, resp. rate 18, height 5\' 1"  (1.549 m), weight 75.8 kg (167 lb), SpO2 94 %.  PHYSICAL EXAMINATION:   GENERAL:  83  y.o.-year-old patient lying in the bed with no acute distress. Somnolent, arousable, able to communicate EYES: Pupils equal, round, reactive to light and accommodation. No scleral icterus. Extraocular muscles intact.  HEENT: Head atraumatic, normocephalic. Oropharynx and nasopharynx clear.  NECK:  Supple, no jugular venous distention. No thyroid enlargement, no tenderness.  LUNGS: Some diminished breath sounds bilaterally, no wheezing, rales,rhonchi or crepitation. No use of accessory muscles of respiration.  CARDIOVASCULAR: S1, S2 normal. No murmurs, rubs, or gallops.  ABDOMEN: Soft, nontender, nondistended. Bowel sounds present. No organomegaly or mass.  EXTREMITIES: No pedal edema, cyanosis, or clubbing.  NEUROLOGIC: Cranial nerves II through XII are intact. Muscle strength 5/5 in all extremities. Sensation intact. Gait not checked.  PSYCHIATRIC: The patient is somnolent, not oriented  SKIN: No obvious rash, lesion, or ulcer.   ORDERS/RESULTS REVIEWED:   CBC  Recent Labs Lab 02/16/16 2124 02/17/16 0500  WBC 9.6 8.2  HGB 13.3 12.5  HCT 39.8 37.3  PLT 172 151  MCV 88.9 89.1  MCH 29.8 29.8  MCHC 33.5 33.4  RDW 14.5 14.3   ------------------------------------------------------------------------------------------------------------------  Chemistries   Recent Labs Lab 02/16/16 2124 02/17/16 0500 02/18/16 0727 02/20/16 0511  NA 138 141 134*  --   K 4.6 3.9 4.0  --   CL 108 112* 105  --   CO2 23 22 22   --   GLUCOSE 217* 96 82  --   BUN 51* 42* 41*  --   CREATININE 2.23* 1.83* 1.89* 1.96*  CALCIUM 8.9 8.6* 8.2*  --    ------------------------------------------------------------------------------------------------------------------ estimated creatinine clearance is 17.4 mL/min (by C-G formula based on SCr of 1.96 mg/dL (H)). ------------------------------------------------------------------------------------------------------------------ No results for input(s): TSH,  T4TOTAL, T3FREE, THYROIDAB in the last 72 hours.  Invalid input(s): FREET3  Cardiac Enzymes  Recent Labs Lab 02/16/16 2124  TROPONINI 0.03*   ------------------------------------------------------------------------------------------------------------------ Invalid input(s): POCBNP ---------------------------------------------------------------------------------------------------------------  RADIOLOGY: No results found.  EKG:  Orders placed or performed during the hospital encounter of 02/16/16  . ED EKG  . ED EKG  . EKG 12-Lead  . EKG 12-Lead  . EKG 12-Lead  . EKG 12-Lead    ASSESSMENT AND PLAN:  Active Problems:   Dehydration   Bacteremia  #1. Urinary tract infection due to Citrobacter and Streptococcus, Continue cefpodoxime for total of 7 day course, per ID  #2. Coagulase negative Staphylococcus,  contamination, repeated blood cultures were negative, unremarkable echocardiogram with normal ejection fraction, no valvular abnormalities, grade 1 diastolic dysfunction, off vancomycin  #3 metabolic encephalopathy, resolved, now at baseline per family members  #4. Generalized weakness, physical therapist to reassess patient and decide regards to rehabilitation placement versus home with home health, per social worker  #5. Acute on chronic renal insufficiency, improved to baseline, now off IV fluids, oral intake is good #6. Elevated troponin, echocardiogram was unremarkable, suspect demand ischemia, no further interventions  #7. MRSA carrier, continue mupirocin  Management plans discussed with the patient, family and they are in agreement.   DRUG ALLERGIES:  Allergies  Allergen Reactions  . Ace Inhibitors Cough  . Ciprofloxacin Other (See Comments)  . Other Other (See Comments)  . Penicillins Rash  . Sulfa Antibiotics Itching, Rash and Other (See Comments)    CODE STATUS:     Code Status Orders        Start     Ordered   02/17/16 0458  Do not attempt  resuscitation (DNR)  Continuous    Question Answer Comment  In the event of cardiac or respiratory ARREST Do not call a "code blue"   In the event of cardiac or respiratory ARREST Do not perform Intubation, CPR, defibrillation or ACLS   In the event of cardiac or respiratory ARREST Use medication by any route, position, wound care, and other measures to relive pain and suffering. May use oxygen, suction and manual treatment of airway obstruction as needed for comfort.      02/17/16 0458    Code Status History    Date Active Date Inactive Code Status Order ID Comments User Context   This patient has a current code status but no historical code status.    Advance Directive Documentation   Flowsheet Row Most Recent Value  Type of Advance Directive  Out of facility DNR (pink MOST or yellow form)  Pre-existing out of facility DNR order (yellow form or pink MOST form)  No data  "MOST" Form in Place?  No data      TOTAL TIME TAKING CARE OF THIS PATIENT: 25 minutes.   Discussed with patient's family and caregiver Nyjah Denio M.D on 02/20/2016 at 2:32 PM  Between 7am to 6pm - Pager - [phone]  After 6pm go to [email or web address] - password [email or web address]  [facility]  Office  [phone]  CC: Primary care physician; Pcp Not In System

## 2016-02-20 NOTE — Progress Notes (Signed)
[facility] CLINIC INFECTIOUS DISEASE PROGRESS NOTE Date of Admission:  02/16/2016     ID: Heather Curtis is a 83 y.o. female with  UTI, CoNS bacteremia Active Problems:   Dehydration   Bacteremia   Subjective: No fevers, more alert  ROS  Eleven systems are reviewed and negative except per hpi  Medications:  Antibiotics Given (last 72 hours)    Date/Time Action Medication Dose Rate   02/18/16 0353 Given   cefTRIAXone (ROCEPHIN) IVPB 1 g 1 g 100 mL/hr   02/18/16 0426 Given   vancomycin (VANCOCIN) IVPB 1000 mg/200 mL premix 1,000 mg 200 mL/hr   02/18/16 1711 Given   meropenem (MERREM) 500 mg in sodium chloride 0.9 % 50 mL IVPB 500 mg 100 mL/hr   02/18/16 2134 Given   meropenem (MERREM) 500 mg in sodium chloride 0.9 % 50 mL IVPB 500 mg 100 mL/hr   02/18/16 2357 Given   vancomycin (VANCOCIN) IVPB 750 mg/150 ml premix 750 mg 150 mL/hr   02/19/16 0823 Given   meropenem (MERREM) 500 mg in sodium chloride 0.9 % 50 mL IVPB 500 mg 100 mL/hr   02/19/16 1631 Given   cefpodoxime (VANTIN) tablet 100 mg 100 mg      . cefpodoxime  100 mg Oral Daily  . donepezil  10 mg Oral QHS  . heparin subcutaneous  5,000 Units Subcutaneous Q8H  . insulin aspart  0-5 Units Subcutaneous QHS  . insulin aspart  0-9 Units Subcutaneous TID WC  . insulin aspart  3 Units Subcutaneous TID WC  . losartan  50 mg Oral Daily  . mupirocin ointment   Nasal BID  . nitroGLYCERIN  1 inch Topical Q6H  . pravastatin  40 mg Oral Daily    Objective: Vital signs in last 24 hours: Temp:  [97.4 F (36.3 C)-98.5 F (36.9 C)] 97.4 F (36.3 C) (01/09 0739) Pulse Rate:  [74-84] 74 (01/09 0739) Resp:  [18] 18 (01/08 1635) BP: (126-153)/(54-60) 153/60 (01/09 0739) SpO2:  [94 %-98 %] 94 % (01/09 0739) Constitutional:  Frail, lying in bed, but arousable HENT: [place]/AT, PERRLA, no scleral icterus Mouth/Throat: Oropharynx is clear and moist. No oropharyngeal exudate.  Cardiovascular: Normal rate, regular rhythm and normal heart  sounds. Pulmonary/Chest: bil rhonchi Neck = supple, no nuchal rigidity Abdominal: Soft. Bowel sounds are normal.  exhibits no distension. There is no tenderness.  Lymphadenopathy: no cervical adenopathy. No axillary adenopathy Neurological: alert and conversant Skin: Skin is warm and dry. No rash noted. No erythema.  Psychiatric: a normal mood and affect.  behavior is normal.  Lab Results  Recent Labs  02/18/16 0727 02/20/16 0511  NA 134*  --   K 4.0  --   CL 105  --   CO2 22  --   BUN 41*  --   CREATININE 1.89* 1.96*    Microbiology: Results for orders placed or performed during the hospital encounter of 02/16/16  Urine culture     Status: Abnormal   Collection Time: 02/16/16  9:27 PM  Result Value Ref Range Status   Specimen Description URINE, RANDOM  Final   Special Requests NONE  Final   Culture (A)  Final    >=100,000 COLONIES/mL CITROBACTER FREUNDII >=100,000 COLONIES/mL GROUP B STREP(S.AGALACTIAE)ISOLATED TESTING AGAINST S. AGALACTIAE NOT ROUTINELY PERFORMED DUE TO PREDICTABILITY OF AMP/PEN/VAN SUSCEPTIBILITY. Performed at [facility]    Report Status 02/19/2016 FINAL  Final   Organism ID, Bacteria CITROBACTER FREUNDII (A)  Final      Susceptibility  Citrobacter freundii - MIC*    CEFAZOLIN >=64 RESISTANT Resistant     CEFTRIAXONE <=1 SENSITIVE Sensitive     CIPROFLOXACIN <=0.25 SENSITIVE Sensitive     GENTAMICIN <=1 SENSITIVE Sensitive     IMIPENEM 2 SENSITIVE Sensitive     NITROFURANTOIN <=16 SENSITIVE Sensitive     TRIMETH/SULFA <=20 SENSITIVE Sensitive     PIP/TAZO <=4 SENSITIVE Sensitive     * >=100,000 COLONIES/mL CITROBACTER FREUNDII  Culture, blood (routine x 2)     Status: Abnormal   Collection Time: 02/16/16  9:50 PM  Result Value Ref Range Status   Specimen Description BLOOD LEFT ANTECUBITAL  Final   Special Requests BOTTLES DRAWN AEROBIC AND ANAEROBIC 6CCAERO,6CCANA  Final   Culture  Setup Time   Final    GRAM POSITIVE  COCCI ANAEROBIC BOTTLE ONLY CRITICAL RESULT CALLED TO, READ BACK BY AND VERIFIED WITH: NATE COOKSON AT 1610 02/18/16.[facility] CONFIRMED BY [facility]    Culture (A)  Final    STAPHYLOCOCCUS SPECIES (COAGULASE NEGATIVE) THE SIGNIFICANCE OF ISOLATING THIS ORGANISM FROM A SINGLE SET OF BLOOD CULTURES WHEN MULTIPLE SETS ARE DRAWN IS UNCERTAIN. PLEASE NOTIFY THE MICROBIOLOGY DEPARTMENT WITHIN ONE WEEK IF SPECIATION AND SENSITIVITIES ARE REQUIRED. Performed at [facility]    Report Status 02/20/2016 FINAL  Final  Blood Culture ID Panel (Reflexed)     Status: Abnormal   Collection Time: 02/16/16  9:50 PM  Result Value Ref Range Status   Enterococcus species NOT DETECTED NOT DETECTED Final   Listeria monocytogenes NOT DETECTED NOT DETECTED Final   Staphylococcus species DETECTED (A) NOT DETECTED Final    Comment: CRITICAL RESULT CALLED TO, READ BACK BY AND VERIFIED WITH: NATE COOKSON AT 9604 02/18/16.[facility]    Staphylococcus aureus NOT DETECTED NOT DETECTED Final   Methicillin resistance DETECTED (A) NOT DETECTED Final    Comment: CRITICAL RESULT CALLED TO, READ BACK BY AND VERIFIED WITH: NATE COOKSON AT 5409 02/18/16.PM[facility]    Streptococcus species NOT DETECTED NOT DETECTED Final   Streptococcus agalactiae NOT DETECTED NOT DETECTED Final   Streptococcus pneumoniae NOT DETECTED NOT DETECTED Final   Streptococcus pyogenes NOT DETECTED NOT DETECTED Final   Acinetobacter baumannii NOT DETECTED NOT DETECTED Final   Enterobacteriaceae species NOT DETECTED NOT DETECTED Final   Enterobacter cloacae complex NOT DETECTED NOT DETECTED Final   Escherichia coli NOT DETECTED NOT DETECTED Final   Klebsiella oxytoca NOT DETECTED NOT DETECTED Final   Klebsiella pneumoniae NOT DETECTED NOT DETECTED Final   Proteus species NOT DETECTED NOT DETECTED Final   Serratia marcescens NOT DETECTED NOT DETECTED Final   Haemophilus influenzae NOT DETECTED NOT DETECTED Final   Neisseria meningitidis NOT DETECTED NOT DETECTED Final    Pseudomonas aeruginosa NOT DETECTED NOT DETECTED Final   Candida albicans NOT DETECTED NOT DETECTED Final   Candida glabrata NOT DETECTED NOT DETECTED Final   Candida krusei NOT DETECTED NOT DETECTED Final   Candida parapsilosis NOT DETECTED NOT DETECTED Final   Candida tropicalis NOT DETECTED NOT DETECTED Final  Rapid Influenza A&B Antigens ([facility] only)     Status: None   Collection Time: 02/16/16 10:34 PM  Result Value Ref Range Status   Influenza A ([facility]) NEGATIVE NEGATIVE Final   Influenza B ([facility]) NEGATIVE NEGATIVE Final  Culture, blood (routine x 2)     Status: None (Preliminary result)   Collection Time: 02/16/16 10:34 PM  Result Value Ref Range Status   Specimen Description BLOOD LEFT ANTECUBITAL  Final   Special Requests BOTTLES DRAWN AEROBIC  AND ANAEROBIC 6CCAERO,7CCANA  Final   Culture NO GROWTH 4 DAYS  Final   Report Status PENDING  Incomplete  MRSA PCR Screening     Status: Abnormal   Collection Time: 02/18/16  3:38 AM  Result Value Ref Range Status   MRSA by PCR POSITIVE (A) NEGATIVE Final    Comment:        The GeneXpert MRSA Assay (FDA approved for NASAL specimens only), is one component of a comprehensive MRSA colonization surveillance program. It is not intended to diagnose MRSA infection nor to guide or monitor treatment for MRSA infections. CRITICAL RESULT CALLED TO, READ BACK BY AND VERIFIED WITH: JACKIE DOUGLAS AT 0506 ON 02/18/16 [facility].   CULTURE, BLOOD (ROUTINE X 2) w Reflex to ID Panel     Status: None (Preliminary result)   Collection Time: 02/18/16  8:18 AM  Result Value Ref Range Status   Specimen Description BLOOD LEFT HAND  Final   Special Requests BOTTLES DRAWN AEROBIC AND ANAEROBIC [id number]  Final   Culture NO GROWTH 2 DAYS  Final   Report Status PENDING  Incomplete  CULTURE, BLOOD (ROUTINE X 2) w Reflex to ID Panel     Status: None (Preliminary result)   Collection Time: 02/18/16  8:32 AM  Result Value Ref Range Status   Specimen  Description BLOOD RIGHT HAND  Final   Special Requests   Final    BOTTLES DRAWN AEROBIC AND ANAEROBIC ANA2ML AER 4 ML   Culture NO GROWTH 2 DAYS  Final   Report Status PENDING  Incomplete  Culture, expectorated sputum-assessment     Status: None   Collection Time: 02/18/16  8:56 AM  Result Value Ref Range Status   Specimen Description SPU  Final   Special Requests NONE  Final   Sputum evaluation THIS SPECIMEN IS ACCEPTABLE FOR SPUTUM CULTURE  Final   Report Status 02/18/2016 FINAL  Final  Culture, respiratory (NON-Expectorated)     Status: None   Collection Time: 02/18/16  8:56 AM  Result Value Ref Range Status   Specimen Description SPU  Final   Special Requests NONE Reflexed from [id number]  Final   Gram Stain   Final    FEW WBC PRESENT, PREDOMINANTLY MONONUCLEAR FEW SQUAMOUS EPITHELIAL CELLS PRESENT MODERATE GRAM NEGATIVE COCCOBACILLI ABUNDANT GRAM POSITIVE COCCI IN PAIRS IN CLUSTERS RARE GRAM POSITIVE RODS    Culture   Final    Consistent with normal respiratory flora. Performed [facility]    Report Status 02/20/2016 FINAL  Final    Studies/Results: No results found.  Assessment/Plan: Heather Curtis is a 83 y.o. female with dementia admitted with decrease po intake, mild fever and found to have UA TNTC and bcx 1/2 with Coag neg staph. She does not have MRSA bacteremia but rather Coag neg staph which is also Methicillin resistant.  I think her main issue is her UTI - with Citrobacter and GB Strep isolated. Clinically improving Sputum cx negative Recommendations Cont cefpodoxime for the UTI for a 7 day course Rec that her caregivers try to have her void in toilet and use depends only for accidents, continue to encourage fluid intake.  Thank you very much for the consult. Will follow with you.  Rima Blizzard P   02/20/2016, 2:42 PM

## 2016-02-20 NOTE — Progress Notes (Signed)
Physical Therapy Treatment Patient Details Name: Heather Curtis MRN: [id number] DOB: 03-20-1922 Today's Date: 02/20/2016    History of Present Illness Pt is a 83 y/o F who presents s/p fall and decreased PO intake. Pt found to have UTI and MRSA. Pt's PMH includes dementia, CKD.    PT Comments    Pt is making gradual progress towards goals with improved mobility efforts this date. Personal aide in room assisted with history and PLOF. Needs assistance for all bed mobility/OOB and currently is not at baseline level. Prior to admission, pt able to ambulate with RW short distances with supervision. Currently recommending SNF at this time.  Follow Up Recommendations  SNF     Equipment Recommendations       Recommendations for Other Services       Precautions / Restrictions Precautions Precautions: Fall Restrictions Weight Bearing Restrictions: No    Mobility  Bed Mobility Overal bed mobility: Needs Assistance Bed Mobility: Supine to Sit;Sit to Supine     Supine to sit: Mod assist;HOB elevated Sit to supine: Mod assist   General bed mobility comments: assist for sliding B LEs and truck elevation. Has difficulty using rail for support. Once seated at EOB, able to sit with cga.  Transfers Overall transfer level: Needs assistance Equipment used: Rolling walker (2 wheeled) Transfers: Sit to/from Stand Sit to Stand: Mod assist         General transfer comment: Needs tactile cues for ant. translation for upright posture. Once standing, able to stand with cga  Ambulation/Gait Ambulation/Gait assistance: Min assist Ambulation Distance (Feet): 5 Feet Assistive device: Rolling walker (2 wheeled) Gait Pattern/deviations: Step-to pattern     General Gait Details: shuffle gait noted with B UE on RW. Pt incontinent during standing, Needs assist for taking steps. Returned to bed for clean up.   Stairs            Wheelchair Mobility    Modified Rankin (Stroke Patients  Only)       Balance                                    Cognition Arousal/Alertness: Lethargic Behavior During Therapy: Anxious Overall Cognitive Status: History of cognitive impairments - at baseline                      Exercises Other Exercises Other Exercises: Pt able to perform 5 reps of B LE SLR and hip abd/add. All ther-ex performed with min assist.    General Comments        Pertinent Vitals/Pain Pain Assessment:  (groans with all movement, however quickly resolves)    Home Living                      Prior Function            PT Goals (current goals can now be found in the care plan section) Acute Rehab PT Goals Patient Stated Goal: none stated PT Goal Formulation: With patient Time For Goal Achievement: 03/03/16 Potential to Achieve Goals: Good Progress towards PT goals: Progressing toward goals    Frequency    Min 2X/week      PT Plan Current plan remains appropriate    Co-evaluation             End of Session Equipment Utilized During Treatment: Gait belt Activity Tolerance: Patient tolerated treatment well  Patient left: in bed;with bed alarm set;with nursing/sitter in room     Time: 1610-9604 PT Time Calculation (min) (ACUTE ONLY): 15 min  Charges:  $Therapeutic Exercise: 8-22 mins                    G Codes:      Pegi Milazzo March 15, 2016, 5:44 PM Elizabeth Palau, PT, DPT [phone]

## 2016-02-20 NOTE — Progress Notes (Addendum)
Clinical Child psychotherapistocial Worker (CSW) received a call from patient's daughter Clint LippsLea who reported that she is now leaning towards rehab at [facility]. Per Dory PeruJoseph [facility] liaison [employer] authorization was started on yesterday and is still pending. Per daughter patient was able to walk short distances with a walker at baseline. CSW requested PT to see patient again to determine if she is rehab appropriate.   PT worked with patient this afternoon and stated that she appropriate for SNF.    B[employer] Inc[employer]Bailey Iridian Reader, LCSW [phone]

## 2016-02-21 LAB — CBC
HCT: 37.4 % (ref 35.0–47.0)
Hemoglobin: 12.5 g/dL (ref 12.0–16.0)
MCH: 29.5 pg (ref 26.0–34.0)
MCHC: 33.5 g/dL (ref 32.0–36.0)
MCV: 88.2 fL (ref 80.0–100.0)
PLATELETS: 168 10*3/uL (ref 150–440)
RBC: 4.24 MIL/uL (ref 3.80–5.20)
RDW: 14.3 % (ref 11.5–14.5)
WBC: 6.2 10*3/uL (ref 3.6–11.0)

## 2016-02-21 LAB — CULTURE, BLOOD (ROUTINE X 2): CULTURE: NO GROWTH

## 2016-02-21 LAB — GLUCOSE, CAPILLARY
Glucose-Capillary: 131 mg/dL — ABNORMAL HIGH (ref 65–99)
Glucose-Capillary: 192 mg/dL — ABNORMAL HIGH (ref 65–99)
Glucose-Capillary: 188 mg/dL — ABNORMAL HIGH (ref 65–99)

## 2016-02-21 MED ORDER — CEFPODOXIME PROXETIL 100 MG PO TABS: 100.0000 mg | ORAL_TABLET | Freq: Every day | ORAL | 0 refills | Status: AC

## 2016-02-21 NOTE — Progress Notes (Signed)
[facility] [place] at [facility]   PATIENT NAME: Heather Curtis    MR#:  [id number]  DATE OF BIRTH:  Aug 02, 1922  SUBJECTIVE:   No acute events overnight. Patient is doing well. She is ready to leave the hospital. We are awaiting insurance approval  REVIEW OF SYSTEMS:    Review of Systems  Constitutional: Positive for malaise/fatigue. Negative for chills and fever.  HENT: Negative.  Negative for ear discharge, ear pain, hearing loss, nosebleeds and sore throat.   Eyes: Negative.  Negative for blurred vision and pain.  Respiratory: Negative.  Negative for cough, hemoptysis, shortness of breath and wheezing.   Cardiovascular: Negative.  Negative for chest pain, palpitations and leg swelling.  Gastrointestinal: Negative.  Negative for abdominal pain, blood in stool, diarrhea, nausea and vomiting.  Genitourinary: Negative.  Negative for dysuria.  Musculoskeletal: Negative.  Negative for back pain.  Skin: Negative.   Neurological: Positive for weakness. Negative for dizziness, tremors, speech change, focal weakness, seizures and headaches.  Endo/Heme/Allergies: Negative.  Does not bruise/bleed easily.  Psychiatric/Behavioral: Negative.  Negative for depression, hallucinations and suicidal ideas.    Tolerating Diet: yes      DRUG ALLERGIES:   Allergies  Allergen Reactions  . Ace Inhibitors Cough  . Ciprofloxacin Other (See Comments)  . Other Other (See Comments)  . Penicillins Rash  . Sulfa Antibiotics Itching, Rash and Other (See Comments)    VITALS:  Blood pressure (!) 152/59, pulse 74, temperature 98.4 F (36.9 C), temperature source Oral, resp. rate 17, height 5\' 1"  (1.549 m), weight 75.8 kg (167 lb), SpO2 94 %.  PHYSICAL EXAMINATION:   Physical Exam  Constitutional: She is oriented to person, place, and time and well-developed, well-nourished, and in no distress. No distress.  HENT:  Head: Normocephalic.  Eyes: No scleral icterus.  Neck: Normal  range of motion. Neck supple. No JVD present. No tracheal deviation present.  Cardiovascular: Normal rate, regular rhythm and normal heart sounds.  Exam reveals no gallop and no friction rub.   No murmur heard. Pulmonary/Chest: Effort normal and breath sounds normal. No respiratory distress. She has no wheezes. She has no rales. She exhibits no tenderness.  Abdominal: Soft. Bowel sounds are normal. She exhibits no distension and no mass. There is no tenderness. There is no rebound and no guarding.  Musculoskeletal: Normal range of motion. She exhibits no edema.  Neurological: She is alert and oriented to person, place, and time.  Skin: Skin is warm. No rash noted. No erythema.  Psychiatric: Affect and judgment normal.      LABORATORY PANEL:   CBC  Recent Labs Lab 02/21/16 0433  WBC 6.2  HGB 12.5  HCT 37.4  PLT 168   ------------------------------------------------------------------------------------------------------------------  Chemistries   Recent Labs Lab 02/18/16 0727 02/20/16 0511  NA 134*  --   K 4.0  --   CL 105  --   CO2 22  --   GLUCOSE 82  --   BUN 41*  --   CREATININE 1.89* 1.96*  CALCIUM 8.2*  --    ------------------------------------------------------------------------------------------------------------------  Cardiac Enzymes  Recent Labs Lab 02/16/16 2124  TROPONINI 0.03*   ------------------------------------------------------------------------------------------------------------------  RADIOLOGY:  No results found.   ASSESSMENT AND PLAN:   83 year old female with history of dementia and diabetes who presented due to weakness and decreased oral intake and found to have urinary tract infection.   1. Generalized weakness due to urinary tract infection Physical therapy is recommended skilled list facility at discharge  2. Citrobacter and GB strep urinary tract infection: Patient is on cefpodoxime for the UTI for a 7 day course As per ID  recommendations.  3. Coag-negative staph bacteremia which is contaminant  4. Acute on chronic kidney disease stage III: Improved with IV fluids  5. Elevated troponin due to poor renal clearance and not ACS Echo shows normal ejection fraction with mild diastolic dysfunction  Management plans discussed with the patient and she  is in agreement.  CODE STATUS: DNR  TOTAL TIME TAKING CARE OF THIS PATIENT: 25 minutes.    POSSIBLE D/C today DEPENDING ON CLINICAL CONDITION.   Mance Vallejo M.D on 02/21/2016 at 9:48 AM  Between 7am to 6pm - Pager - [phone] After 6pm go to [email or web address] - password [email or web address]  [facility] [place] [facility]  Office  [phone]  CC: Primary care physician; Pcp Not In System  Note: This dictation was prepared with Dragon dictation along with smaller phrase technology. Any transcriptional errors that result from this process are unintentional.

## 2016-02-21 NOTE — Progress Notes (Signed)
Pt. Left via EMS

## 2016-02-21 NOTE — Progress Notes (Signed)
Notified by CSW [facility] that patient was being discharged to [facility] for short term rehab, will  not discharge with hospice services. Referral made aware. Thank you. Dayna BarkerKaren Robertson RN, BSN, [facility], [facility] Liaison [phone] c

## 2016-02-21 NOTE — Discharge Summary (Signed)
[facility] - [place] at [facility]   PATIENT NAME: Heather Curtis    MR#:  [id number]  DATE OF BIRTH:  [birth year]  DATE OF ADMISSION:  02/16/2016 ADMITTING PHYSICIAN: Ihor Austin, MD  DATE OF DISCHARGE: 02/21/2016  PRIMARY CARE PHYSICIAN: Pcp Not In System    ADMISSION DIAGNOSIS:  Cough [R05] Weakness [R53.1] Urinary tract infection without hematuria, site unspecified [N39.0]  DISCHARGE DIAGNOSIS:  Active Problems:   Dehydration   Bacteremia   SECONDARY DIAGNOSIS:   Past Medical History:  Diagnosis Date  . CKD (chronic kidney disease)   . Dementia   . Diabetes mellitus without complication (HCC)   . Hypertension     HOSPITAL COURSE:  83 year old female with history of dementia and diabetes who presented due to weakness and decreased oral intake and found to have urinary tract infection.   1. Generalized weakness due to urinary tract infection Physical therapy is recommended skilled list facility at discharge  2. Citrobacter and GB strep urinary tract infection: Patient is on cefpodoxime for the UTI for a 7 day course As per ID recommendations.  3. Coag-negative staph bacteremia which is contaminant  4. Acute on chronic kidney disease stage III: Improved with IV fluids  5. Elevated troponin due to poor renal clearance and not ACS Echo shows normal ejection fraction with mild diastolic dysfunction    DISCHARGE CONDITIONS AND DIET:  Stable  diabetic diet  Needs Palliative care referral  CONSULTS OBTAINED:  Treatment Team:  Mick Sell, MD  DRUG ALLERGIES:   Allergies  Allergen Reactions  . Ace Inhibitors Cough  . Ciprofloxacin Other (See Comments)  . Other Other (See Comments)  . Penicillins Rash  . Sulfa Antibiotics Itching, Rash and Other (See Comments)    DISCHARGE MEDICATIONS:   Current Discharge Medication List    START taking these medications   Details  cefpodoxime (VANTIN) 100 MG tablet Take 1 tablet (100  mg total) by mouth daily. Qty: 6 tablet, Refills: 0      CONTINUE these medications which have NOT CHANGED   Details  donepezil (ARICEPT) 10 MG tablet Take 10 mg by mouth at bedtime.    glimepiride (AMARYL) 2 MG tablet Take 3 mg by mouth daily with breakfast.    insulin aspart (NOVOLOG) 100 UNIT/ML injection Inject 4 Units into the skin daily with breakfast.    insulin detemir (LEVEMIR) 100 UNIT/ML injection Inject into the skin 2 (two) times daily.    losartan (COZAAR) 50 MG tablet Take 50 mg by mouth daily.    pravastatin (PRAVACHOL) 40 MG tablet Take 40 mg by mouth daily.    haloperidol (HALDOL) 0.5 MG tablet Take 0.5 mg by mouth every 8 (eight) hours as needed for agitation.              Today   CHIEF COMPLAINT:  Doing fine this am no events ovenight   VITAL SIGNS:  Blood pressure (!) 152/59, pulse 74, temperature 98.4 F (36.9 C), temperature source Oral, resp. rate 17, height 5\' 1"  (1.549 m), weight 75.8 kg (167 lb), SpO2 94 %.   REVIEW OF SYSTEMS:  Review of Systems  Constitutional: Negative for chills, fever and malaise/fatigue.  HENT: Negative.  Negative for ear discharge, ear pain, hearing loss, nosebleeds and sore throat.   Eyes: Negative.  Negative for blurred vision and pain.  Respiratory: Negative.  Negative for cough, hemoptysis, shortness of breath and wheezing.   Cardiovascular: Negative.  Negative for chest pain, palpitations and leg swelling.  Gastrointestinal: Negative.  Negative for abdominal pain, blood in stool, diarrhea, nausea and vomiting.  Genitourinary: Negative.  Negative for dysuria.  Musculoskeletal: Negative.  Negative for back pain.  Skin: Negative.   Neurological: Positive for weakness. Negative for dizziness, tremors, speech change, focal weakness, seizures and headaches.  Endo/Heme/Allergies: Negative.  Does not bruise/bleed easily.  Psychiatric/Behavioral: Negative.  Negative for depression, hallucinations and suicidal ideas.      PHYSICAL EXAMINATION:  GENERAL:  83 y.o.-year-old patient lying in the bed with no acute distress.  NECK:  Supple, no jugular venous distention. No thyroid enlargement, no tenderness.  LUNGS: Normal breath sounds bilaterally, no wheezing, rales,rhonchi  No use of accessory muscles of respiration.  CARDIOVASCULAR: S1, S2 normal. No murmurs, rubs, or gallops.  ABDOMEN: Soft, non-tender, non-distended. Bowel sounds present. No organomegaly or mass.  EXTREMITIES: No pedal edema, cyanosis, or clubbing.  PSYCHIATRIC: The patient is alert and oriented x 3.  SKIN: No obvious rash, lesion, or ulcer.   DATA REVIEW:   CBC  Recent Labs Lab 02/21/16 0433  WBC 6.2  HGB 12.5  HCT 37.4  PLT 168    Chemistries   Recent Labs Lab 02/18/16 0727 02/20/16 0511  NA 134*  --   K 4.0  --   CL 105  --   CO2 22  --   GLUCOSE 82  --   BUN 41*  --   CREATININE 1.89* 1.96*  CALCIUM 8.2*  --     Cardiac Enzymes  Recent Labs Lab 02/16/16 2124  TROPONINI 0.03*    Microbiology Results  @[email or web address] @  RADIOLOGY:  No results found.    Management plans discussed with the patient and she is in agreement. Stable for discharge snf  Patient should follow up with pcp at facility  CODE STATUS:     Code Status Orders        Start     Ordered   02/17/16 0458  Do not attempt resuscitation (DNR)  Continuous    Question Answer Comment  In the event of cardiac or respiratory ARREST Do not call a "code blue"   In the event of cardiac or respiratory ARREST Do not perform Intubation, CPR, defibrillation or ACLS   In the event of cardiac or respiratory ARREST Use medication by any route, position, wound care, and other measures to relive pain and suffering. May use oxygen, suction and manual treatment of airway obstruction as needed for comfort.      02/17/16 0458    Code Status History    Date Active Date Inactive Code Status Order ID Comments User Context   This patient has a  current code status but no historical code status.    Advance Directive Documentation   Flowsheet Row Most Recent Value  Type of Advance Directive  Out of facility DNR (pink MOST or yellow form)  Pre-existing out of facility DNR order (yellow form or pink MOST form)  No data  "MOST" Form in Place?  No data      TOTAL TIME TAKING CARE OF THIS PATIENT: 36 minutes.    Note: This dictation was prepared with Dragon dictation along with smaller phrase technology. Any transcriptional errors that result from this process are unintentional.  Alianah Lofton M.D on 02/21/2016 at 3:09 PM  Between 7am to 6pm - Pager - [phone] After 6pm go to [email or web address] - password [email or web address]  [facility] [place] [facility]  Office  [phone]  CC: Primary care physician; Pcp Not In System

## 2016-02-21 NOTE — Progress Notes (Signed)
Called report to [place]achel, [facility], answered all questions. EMS called for transport.

## 2016-02-21 NOTE — Progress Notes (Signed)
Patient is medically stable for D/C to [facility] today. Per [facility] liaison [employer] authorization has been received and patient will go to room 810. RN will call report to RN Konrad DoloresKim Hicks at [phone] and arrange EMS for transport. Clinical Child psychotherapistocial Worker (CSW) sent D/C orders to [facility] [employer] [employer] via [employer]. Patient will D/C with palliative care to follow. [facility] is aware of above. CSW contacted patient's daughter Clint LippsLea and made her aware of above. Please reconsult if future social work needs arise. CSW signing off.   B[employer] Hughes IncorporatedBailey Cheri Ayotte, LCSW [phone]

## 2016-02-21 NOTE — Clinical Social Work Placement (Signed)
   CLINICAL SOCIAL WORK PLACEMENT  NOTE  Date:  02/21/2016  Patient Details  Name: Heather Curtis MRN: [id number] Date of Birth: 23-Nov-1922  Clinical Social Work is seeking post-discharge placement for this patient at the Skilled  Nursing Facility level of care (*CSW will initial, date and re-position this form in  chart as items are completed):  Yes   Patient/family provided with [facility] Clinical Social Work Department's list of facilities offering this level of care within the geographic area requested by the patient (or if unable, by the patient's family).  Yes   Patient/family informed of their freedom to choose among providers that offer the needed level of care, that participate in Medicare, Medicaid or managed care program needed by the patient, have an available bed and are willing to accept the patient.  Yes   Patient/family informed of [facility] ownership interest in [facility] and [facility], as well as of the fact that they are under no obligation to receive care at these facilities.  PASRR submitted to EDS on 02/19/16     PASRR number received on 02/19/16     Existing PASRR number confirmed on       FL2 transmitted to all facilities in geographic area requested by pt/family on 02/19/16     FL2 transmitted to all facilities within larger geographic area on       Patient informed that his/her managed care company has contracts with or will negotiate with certain facilities, including the following:        Yes   Patient/family informed of bed offers received.  Patient chooses bed at  ([facility] )     Physician recommends and patient chooses bed at      Patient to be transferred to  ([facility] ) on 02/21/16.  Patient to be transferred to facility by  [facility] )     Patient family notified on 02/21/16 of transfer.  Name of family member notified:   (Patient's daughter Heather Curtis is aware of D/C today. )     PHYSICIAN       Additional Comment:     _______________________________________________ Khyri Hinzman, Darleen CrockerBailey M, LCSW 02/21/2016, 3:18 PM

## 2016-02-23 LAB — CULTURE, BLOOD (ROUTINE X 2)
CULTURE: NO GROWTH
Culture: NO GROWTH

## 2016-06-30 ENCOUNTER — Emergency Department
Admission: EM | Admit: 2016-06-30 | Discharge: 2016-06-30 | Disposition: A | Discharge status: Home / Self Care | Payer: Medicare Other | Attending: Emergency Medicine | Admitting: Emergency Medicine

## 2016-06-30 ENCOUNTER — Emergency Department: Payer: Medicare Other

## 2016-06-30 DIAGNOSIS — N3000 Acute cystitis without hematuria: Secondary | ICD-10-CM | POA: Diagnosis not present

## 2016-06-30 DIAGNOSIS — E1165 Type 2 diabetes mellitus with hyperglycemia: Secondary | ICD-10-CM

## 2016-06-30 DIAGNOSIS — Z7984 Long term (current) use of oral hypoglycemic drugs: Secondary | ICD-10-CM | POA: Insufficient documentation

## 2016-06-30 DIAGNOSIS — E1122 Type 2 diabetes mellitus with diabetic chronic kidney disease: Secondary | ICD-10-CM | POA: Insufficient documentation

## 2016-06-30 DIAGNOSIS — I129 Hypertensive chronic kidney disease with stage 1 through stage 4 chronic kidney disease, or unspecified chronic kidney disease: Secondary | ICD-10-CM | POA: Diagnosis not present

## 2016-06-30 DIAGNOSIS — Z794 Long term (current) use of insulin: Secondary | ICD-10-CM | POA: Diagnosis not present

## 2016-06-30 DIAGNOSIS — N189 Chronic kidney disease, unspecified: Secondary | ICD-10-CM | POA: Insufficient documentation

## 2016-06-30 LAB — CBC
HEMATOCRIT: 41.7 % (ref 35.0–47.0)
HEMOGLOBIN: 13.8 g/dL (ref 12.0–16.0)
MCH: 29.6 pg (ref 26.0–34.0)
MCHC: 33 g/dL (ref 32.0–36.0)
MCV: 89.5 fL (ref 80.0–100.0)
Platelets: 189 10*3/uL (ref 150–440)
RBC: 4.66 MIL/uL (ref 3.80–5.20)
RDW: 14.4 % (ref 11.5–14.5)
WBC: 12.5 10*3/uL — ABNORMAL HIGH (ref 3.6–11.0)

## 2016-06-30 LAB — BASIC METABOLIC PANEL
ANION GAP: 6 (ref 5–15)
BUN: 40 mg/dL — ABNORMAL HIGH (ref 6–20)
CO2: 22 mmol/L (ref 22–32)
Calcium: 8.6 mg/dL — ABNORMAL LOW (ref 8.9–10.3)
Chloride: 103 mmol/L (ref 101–111)
Creatinine, Ser: 1.97 mg/dL — ABNORMAL HIGH (ref 0.44–1.00)
GFR calc Af Amer: 24 mL/min — ABNORMAL LOW (ref >60)
GFR calc non Af Amer: 21 mL/min — ABNORMAL LOW (ref >60)
GLUCOSE: 337 mg/dL — AB (ref 65–99)
POTASSIUM: 5.4 mmol/L — AB (ref 3.5–5.1)
Sodium: 131 mmol/L — ABNORMAL LOW (ref 135–145)

## 2016-06-30 LAB — GLUCOSE, CAPILLARY
GLUCOSE-CAPILLARY: 190 mg/dL — AB (ref 65–99)
Glucose-Capillary: 360 mg/dL — ABNORMAL HIGH (ref 65–99)

## 2016-06-30 LAB — URINALYSIS, COMPLETE (UACMP) WITH MICROSCOPIC
BILIRUBIN URINE: NEGATIVE
GLUCOSE, UA: 150 mg/dL — AB
KETONES UR: NEGATIVE mg/dL
NITRITE: POSITIVE — AB
PH: 5 (ref 5.0–8.0)
PROTEIN: 30 mg/dL — AB
Specific Gravity, Urine: 1.011 (ref 1.005–1.030)

## 2016-06-30 MED ORDER — CEPHALEXIN 500 MG PO CAPS: 500.0000 mg | ORAL_CAPSULE | Freq: Three times a day (TID) | ORAL | 0 refills | Status: AC

## 2016-06-30 MED ORDER — CEFTRIAXONE SODIUM IN DEXTROSE 20 MG/ML IV SOLN
1.0000 g | Freq: Once | INTRAVENOUS | Status: AC
  Administered 2016-06-30: 1 g via INTRAVENOUS
  Filled 2016-06-30: qty 50

## 2016-06-30 MED ORDER — SODIUM CHLORIDE 0.9 % IV BOLUS (SEPSIS)
1000.0000 mL | Freq: Once | INTRAVENOUS | Status: AC
  Administered 2016-06-30: 1000 mL via INTRAVENOUS

## 2016-06-30 MED ORDER — INSULIN ASPART 100 UNIT/ML ~~LOC~~ SOLN
4.0000 [IU] | Freq: Once | SUBCUTANEOUS | Status: AC
  Administered 2016-06-30: 4 [IU] via SUBCUTANEOUS
  Filled 2016-06-30: qty 4

## 2016-06-30 NOTE — ED Triage Notes (Signed)
[facility]. Per EMS staff reported CBG 302 this morning, and c/o headache that has resolved. [facility] administering 2 units of insulin as a part of routine morning medication, but reported pt does not have sliding scale coverage. Pt alert and oriented in no acute distress at this time.

## 2016-06-30 NOTE — ED Notes (Signed)
Patient transported to X-ray 

## 2016-06-30 NOTE — ED Provider Notes (Signed)
[facility] Emergency Department Provider Note  ____________________________________________  Time seen: Approximately 11:26 AM  I have reviewed the triage vital signs and the nursing notes.   HISTORY  Chief Complaint Hyperglycemia  Level 5 caveat:  Portions of the history and physical were unable to be obtained due to dementia   HPI Heather Curtis is a 83 y.o. female with h/o DM2, dementia, HTN, CKD who presents from [facility] with elevated BG. Patient's blood glucose this morning was 302. Per staff from the facility patient does not have a sliding scale coverage. She is on Levemir twice a day and has short acting insulin 2 units with breakfast and dinner and 4 units with lunch. Family says the patient has recently moved from home to [facility] a week ago. The daughter who usually manages patient's medications is now on vacation and traveling. The son is at the bedside and tells me he is not sure if there has been any changes in her current insulin regimen. He tells me the patient has had a dry cough for the last few days. No fever, nausea or vomiting, no chest pain or shortness of breath, no abdominal pain, no dysuria or hematuria. Patient's mental status at her baseline per son.Patient tells me she had a headache earlier this morning that was moderate, diffuse, nonradiating. That has resolved without intervention.  Past Medical History:  Diagnosis Date  . CKD (chronic kidney disease)   . Dementia   . Diabetes mellitus without complication (HCC)   . Hypertension     Patient Active Problem List   Diagnosis Date Noted  . Bacteremia 02/18/2016  . Dehydration 02/17/2016    Past Surgical History:  Procedure Laterality Date  . ABDOMINAL HYSTERECTOMY    . CHOLECYSTECTOMY      Prior to Admission medications   Medication Sig Start Date End Date Taking? Authorizing Provider  donepezil (ARICEPT) 10 MG tablet Take 10 mg by mouth 3 (three) times daily.     Yes [provider]  glimepiride (AMARYL) 2 MG tablet Take 3 mg by mouth daily with breakfast.   Yes [provider]  insulin aspart (NOVOLOG) 100 UNIT/ML injection Inject 4 Units into the skin daily with breakfast.   Yes [provider]  insulin detemir (LEVEMIR) 100 UNIT/ML injection Inject 2-12 Units into the skin See admin instructions. 12 units in am and 8 units qhs   Yes [provider]  insulin lispro (HUMALOG) 100 UNIT/ML injection Inject 2-4 Units into the skin 3 (three) times daily before meals. 2 units qam, 4 units @@ noon, and 2 units qpm   Yes [provider]  LORazepam (ATIVAN) 0.5 MG tablet Take 0.5 mg by mouth every 6 (six) hours as needed for anxiety.   Yes [provider]  losartan (COZAAR) 50 MG tablet Take 50 mg by mouth daily.   Yes [provider]  pravastatin (PRAVACHOL) 40 MG tablet Take 40 mg by mouth daily.   Yes [provider]  cephALEXin (KEFLEX) 500 MG capsule Take 1 capsule (500 mg total) by mouth 3 (three) times daily. 06/30/16 07/07/16  Nita Sickle, MD    Allergies Ace inhibitors; Ciprofloxacin; Other; Penicillins; and Sulfa antibiotics  History reviewed. No pertinent family history.  Social History Social History  Substance Use Topics  . Smoking status: Never Smoker  . Smokeless tobacco: Never Used  . Alcohol use No    Review of Systems  Constitutional: Negative for fever. Eyes: Negative for  visual changes. ENT: Negative for sore throat. Neck: No neck pain  Cardiovascular: Negative for chest pain. Respiratory: Negative for shortness of breath. + cough Gastrointestinal: Negative for abdominal pain, vomiting or diarrhea. Genitourinary: Negative for dysuria. Musculoskeletal: Negative for back pain. Skin: Negative for rash. Neurological: Negative for weakness or numbness. + HA Psych: No SI or HI  ____________________________________________   PHYSICAL EXAM:  VITAL  SIGNS: ED Triage Vitals  Enc Vitals Group     BP 06/30/16 1024 (!) 171/72     Pulse Rate 06/30/16 1024 82     Resp 06/30/16 1024 20     Temp 06/30/16 1024 98.7 F (37.1 C)     Temp Source 06/30/16 1024 Oral     SpO2 06/30/16 1024 97 %     Weight 06/30/16 1025 152 lb (68.9 kg)     Height 06/30/16 1025 5\' 2"  (1.575 m)     Head Circumference --      Peak Flow --      Pain Score --      Pain Loc --      Pain Edu? --      Excl. in GC? --     Constitutional: Alert and oriented x 2. Well appearing and in no apparent distress. HEENT:      Head: Normocephalic and atraumatic.         Eyes: Conjunctivae are normal. Sclera is non-icteric.       Mouth/Throat: Mucous membranes are moist.       Neck: Supple with no signs of meningismus. Cardiovascular: Regular rate and rhythm. No murmurs, gallops, or rubs. 2+ symmetrical distal pulses are present in all extremities. No JVD. Respiratory: Normal respiratory effort. Lungs are clear to auscultation bilaterally. No wheezes, crackles, or rhonchi.  Gastrointestinal: Soft, non tender, and non distended with positive bowel sounds. No rebound or guarding. Musculoskeletal: Nontender with normal range of motion in all extremities. No edema, cyanosis, or erythema of extremities. Neurologic: Normal speech and language. Face is symmetric. Moving all extremities. No gross focal neurologic deficits are appreciated. Skin: Skin is warm, dry and intact. No rash noted. Psychiatric: Mood and affect are normal. Speech and behavior are normal.  ____________________________________________   LABS (all labs ordered are listed, but only abnormal results are displayed)  Labs Reviewed  GLUCOSE, CAPILLARY - Abnormal; Notable for the following:       Result Value   Glucose-Capillary 360 (*)    All other components within normal limits  BASIC METABOLIC PANEL - Abnormal; Notable for the following:    Sodium 131 (*)    Potassium 5.4 (*)    Glucose, Bld 337 (*)    BUN  40 (*)    Creatinine, Ser 1.97 (*)    Calcium 8.6 (*)    GFR calc non Af Amer 21 (*)    GFR calc Af Amer 24 (*)    All other components within normal limits  CBC - Abnormal; Notable for the following:    WBC 12.5 (*)    All other components within normal limits  URINALYSIS, COMPLETE (UACMP) WITH MICROSCOPIC - Abnormal; Notable for the following:    Color, Urine YELLOW (*)    APPearance CLOUDY (*)    Glucose, UA 150 (*)    Hgb urine dipstick MODERATE (*)    Protein, ur 30 (*)    Nitrite POSITIVE (*)    Leukocytes, UA MODERATE (*)    Bacteria, UA MANY (*)    Squamous Epithelial / LPF 0-5 (*)  All other components within normal limits  GLUCOSE, CAPILLARY - Abnormal; Notable for the following:    Glucose-Capillary 190 (*)    All other components within normal limits  CBG MONITORING, ED   ____________________________________________  EKG   ED ECG REPORT I, Nita Sickle, the attending physician, personally viewed and interpreted this ECG.  Normal sinus rhythm, rate of 80, normal intervals, normal axis, no ST elevations or depressions, T-wave inversion in lead 3. Unchanged from prior from January 2018  ____________________________________________  RADIOLOGY  CXR:  Cardiomegaly. Lingular atelectasis or scarring ____________________________________________   PROCEDURES  Procedure(s) performed: None Procedures Critical Care performed:  None ____________________________________________   INITIAL IMPRESSION / ASSESSMENT AND PLAN / ED COURSE  83 y.o. female with h/o DM2, dementia, HTN, CKD who presents from [facility] with elevated BG. Patient was recently transitioned from home to [facility]. Unclear if there has been any new changes in her insulin regimen. She is alert and oriented 2 and extremely well-appearing, her vitals are within normal limits, she is neurologically intact, physical exam and no acute findings. Blood glucose here was 360, BMP and urine are  pending. We'll do a chest x-ray since patient is having cough. We'll give IV fluids. We'll check urine for evidence of infection.  Clinical Course as of Jul 01 1430  Sun Jun 30, 2016  1429 Patient found to have a UTI. She was given Rocephin. She can be discharged act her facility on Keflex. No evidence of DKA. Blood glucose now is 190. She is tolerating by mouth. Vitals are within normal limits. I discussed with patient's daughter who tells me that the facility is not capable of doing a sliding scale and she has been working with the facility to adjust patient's insulin regimen. Since we found a culprit for her hyperglycemia I will not make any changes to her insulin at this time. Will have patient follow up with her primary care doctor tomorrow. Recommended return to the emergency room if patient has flank pain, abdominal pain, fever, or any new symptoms concerning to the family  [CV]    Clinical Course User Index [CV] Nita Sickle, MD    Pertinent labs & imaging results that were available during my care of the patient were reviewed by me and considered in my medical decision making (see chart for details).    ____________________________________________   FINAL CLINICAL IMPRESSION(S) / ED DIAGNOSES  Final diagnoses:  Acute cystitis without hematuria  Type 2 diabetes mellitus with hyperglycemia, with long-term current use of insulin (HCC)      NEW MEDICATIONS STARTED DURING THIS VISIT:  New Prescriptions   CEPHALEXIN (KEFLEX) 500 MG CAPSULE    Take 1 capsule (500 mg total) by mouth 3 (three) times daily.     Note:  This document was prepared using Dragon voice recognition software and may include unintentional dictation errors.    Don Perking, [place], MD 06/30/16 [phone]

## 2016-09-07 ENCOUNTER — Emergency Department: Payer: Medicare Other

## 2016-09-07 ENCOUNTER — Inpatient Hospital Stay
Admission: EM | Admit: 2016-09-07 | Discharge: 2016-09-13 | DRG: 593 | Disposition: A | Discharge status: Skilled Nursing Facility | Payer: Medicare Other | Attending: Internal Medicine | Admitting: Internal Medicine

## 2016-09-07 DIAGNOSIS — L89613 Pressure ulcer of right heel, stage 3: Secondary | ICD-10-CM | POA: Diagnosis present

## 2016-09-07 DIAGNOSIS — Z79899 Other long term (current) drug therapy: Secondary | ICD-10-CM

## 2016-09-07 DIAGNOSIS — Z888 Allergy status to other drugs, medicaments and biological substances status: Secondary | ICD-10-CM | POA: Diagnosis not present

## 2016-09-07 DIAGNOSIS — R0602 Shortness of breath: Secondary | ICD-10-CM | POA: Diagnosis present

## 2016-09-07 DIAGNOSIS — Z882 Allergy status to sulfonamides status: Secondary | ICD-10-CM

## 2016-09-07 DIAGNOSIS — T148XXA Other injury of unspecified body region, initial encounter: Secondary | ICD-10-CM

## 2016-09-07 DIAGNOSIS — L089 Local infection of the skin and subcutaneous tissue, unspecified: Secondary | ICD-10-CM | POA: Diagnosis present

## 2016-09-07 DIAGNOSIS — L89152 Pressure ulcer of sacral region, stage 2: Principal | ICD-10-CM | POA: Diagnosis present

## 2016-09-07 DIAGNOSIS — N184 Chronic kidney disease, stage 4 (severe): Secondary | ICD-10-CM | POA: Diagnosis present

## 2016-09-07 DIAGNOSIS — E11621 Type 2 diabetes mellitus with foot ulcer: Secondary | ICD-10-CM | POA: Diagnosis present

## 2016-09-07 DIAGNOSIS — E1122 Type 2 diabetes mellitus with diabetic chronic kidney disease: Secondary | ICD-10-CM | POA: Diagnosis present

## 2016-09-07 DIAGNOSIS — L03312 Cellulitis of back [any part except buttock]: Secondary | ICD-10-CM | POA: Diagnosis present

## 2016-09-07 DIAGNOSIS — Z880 Allergy status to penicillin: Secondary | ICD-10-CM | POA: Diagnosis not present

## 2016-09-07 DIAGNOSIS — Z7401 Bed confinement status: Secondary | ICD-10-CM | POA: Diagnosis not present

## 2016-09-07 DIAGNOSIS — I129 Hypertensive chronic kidney disease with stage 1 through stage 4 chronic kidney disease, or unspecified chronic kidney disease: Secondary | ICD-10-CM | POA: Diagnosis present

## 2016-09-07 DIAGNOSIS — L8992 Pressure ulcer of unspecified site, stage 2: Secondary | ICD-10-CM

## 2016-09-07 DIAGNOSIS — L97419 Non-pressure chronic ulcer of right heel and midfoot with unspecified severity: Secondary | ICD-10-CM | POA: Diagnosis present

## 2016-09-07 DIAGNOSIS — F0390 Unspecified dementia without behavioral disturbance: Secondary | ICD-10-CM | POA: Diagnosis present

## 2016-09-07 DIAGNOSIS — Z794 Long term (current) use of insulin: Secondary | ICD-10-CM | POA: Diagnosis not present

## 2016-09-07 DIAGNOSIS — Z883 Allergy status to other anti-infective agents status: Secondary | ICD-10-CM | POA: Diagnosis not present

## 2016-09-07 DIAGNOSIS — Z66 Do not resuscitate: Secondary | ICD-10-CM | POA: Diagnosis present

## 2016-09-07 LAB — BASIC METABOLIC PANEL
Anion gap: 8 (ref 5–15)
BUN: 27 mg/dL — AB (ref 6–20)
CALCIUM: 8.7 mg/dL — AB (ref 8.9–10.3)
CO2: 25 mmol/L (ref 22–32)
CREATININE: 1.81 mg/dL — AB (ref 0.44–1.00)
Chloride: 107 mmol/L (ref 101–111)
GFR calc Af Amer: 27 mL/min — ABNORMAL LOW (ref >60)
GFR, EST NON AFRICAN AMERICAN: 23 mL/min — AB (ref >60)
GLUCOSE: 109 mg/dL — AB (ref 65–99)
POTASSIUM: 4.2 mmol/L (ref 3.5–5.1)
Sodium: 140 mmol/L (ref 135–145)

## 2016-09-07 LAB — CBC
HEMATOCRIT: 38.2 % (ref 35.0–47.0)
Hemoglobin: 12.8 g/dL (ref 12.0–16.0)
MCH: 29.3 pg (ref 26.0–34.0)
MCHC: 33.5 g/dL (ref 32.0–36.0)
MCV: 87.3 fL (ref 80.0–100.0)
Platelets: 275 10*3/uL (ref 150–440)
RBC: 4.38 MIL/uL (ref 3.80–5.20)
RDW: 14.1 % (ref 11.5–14.5)
WBC: 12.3 10*3/uL — ABNORMAL HIGH (ref 3.6–11.0)

## 2016-09-07 LAB — MRSA PCR SCREENING: MRSA by PCR: POSITIVE — AB

## 2016-09-07 LAB — GLUCOSE, CAPILLARY
GLUCOSE-CAPILLARY: 234 mg/dL — AB (ref 65–99)
Glucose-Capillary: 106 mg/dL — ABNORMAL HIGH (ref 65–99)

## 2016-09-07 LAB — TROPONIN I: Troponin I: <0.03 ng/mL (ref <0.03)

## 2016-09-07 LAB — BRAIN NATRIURETIC PEPTIDE: B NATRIURETIC PEPTIDE 5: 218 pg/mL — AB (ref 0.0–100.0)

## 2016-09-07 MED ORDER — SODIUM CHLORIDE 0.9% FLUSH
3.0000 mL | INTRAVENOUS | Status: DC | PRN
Start: 2016-09-07 — End: 2016-09-10

## 2016-09-07 MED ORDER — INSULIN ASPART 100 UNIT/ML ~~LOC~~ SOLN
0.0000 [IU] | Freq: Every day | SUBCUTANEOUS | Status: DC
  Administered 2016-09-07 – 2016-09-12 (×2): 2 [IU] via SUBCUTANEOUS
  Filled 2016-09-07 (×2): qty 1

## 2016-09-07 MED ORDER — INSULIN ASPART 100 UNIT/ML ~~LOC~~ SOLN
0.0000 [IU] | Freq: Three times a day (TID) | SUBCUTANEOUS | Status: DC
  Administered 2016-09-08: 2 [IU] via SUBCUTANEOUS
  Administered 2016-09-08: 1 [IU] via SUBCUTANEOUS
  Administered 2016-09-08: 2 [IU] via SUBCUTANEOUS
  Administered 2016-09-09: 09:00:00 1 [IU] via SUBCUTANEOUS
  Administered 2016-09-09: 2 [IU] via SUBCUTANEOUS
  Administered 2016-09-09 – 2016-09-10 (×2): 1 [IU] via SUBCUTANEOUS
  Administered 2016-09-10 (×2): 2 [IU] via SUBCUTANEOUS
  Administered 2016-09-11: 1 [IU] via SUBCUTANEOUS
  Administered 2016-09-11 (×2): 2 [IU] via SUBCUTANEOUS
  Administered 2016-09-12: 08:00:00 1 [IU] via SUBCUTANEOUS
  Administered 2016-09-12 (×2): 3 [IU] via SUBCUTANEOUS
  Administered 2016-09-13: 1 [IU] via SUBCUTANEOUS
  Administered 2016-09-13: 09:00:00 2 [IU] via SUBCUTANEOUS
  Filled 2016-09-07 (×17): qty 1

## 2016-09-07 MED ORDER — CLINDAMYCIN PHOSPHATE 300 MG/50ML IV SOLN
300.0000 mg | Freq: Three times a day (TID) | INTRAVENOUS | Status: DC
  Administered 2016-09-07 – 2016-09-09 (×6): 300 mg via INTRAVENOUS
  Filled 2016-09-07 (×11): qty 50

## 2016-09-07 MED ORDER — HYDRALAZINE HCL 20 MG/ML IJ SOLN: 10.0000 mg | Freq: Four times a day (QID) | INTRAMUSCULAR | Status: DC | PRN

## 2016-09-07 MED ORDER — INSULIN DETEMIR 100 UNIT/ML ~~LOC~~ SOLN
22.0000 [IU] | Freq: Every day | SUBCUTANEOUS | Status: DC
  Administered 2016-09-08 – 2016-09-13 (×6): 22 [IU] via SUBCUTANEOUS
  Filled 2016-09-07 (×7): qty 0.22

## 2016-09-07 MED ORDER — DONEPEZIL HCL 5 MG PO TABS
10.0000 mg | ORAL_TABLET | Freq: Three times a day (TID) | ORAL | Status: DC
  Administered 2016-09-07 – 2016-09-13 (×17): 10 mg via ORAL
  Filled 2016-09-07: qty 1
  Filled 2016-09-07 (×5): qty 2
  Filled 2016-09-07: qty 1
  Filled 2016-09-07 (×4): qty 2
  Filled 2016-09-07 (×2): qty 1
  Filled 2016-09-07: qty 2
  Filled 2016-09-07 (×2): qty 1
  Filled 2016-09-07 (×2): qty 2
  Filled 2016-09-07 (×2): qty 1
  Filled 2016-09-07 (×2): qty 2

## 2016-09-07 MED ORDER — SODIUM CHLORIDE 0.9 % IV SOLN: 250.0000 mL | INTRAVENOUS | Status: DC | PRN

## 2016-09-07 MED ORDER — HEPARIN SODIUM (PORCINE) 5000 UNIT/ML IJ SOLN
5000.0000 [IU] | Freq: Three times a day (TID) | INTRAMUSCULAR | Status: DC
  Administered 2016-09-07 – 2016-09-13 (×18): 5000 [IU] via SUBCUTANEOUS
  Filled 2016-09-07 (×18): qty 1

## 2016-09-07 MED ORDER — LOSARTAN POTASSIUM 50 MG PO TABS
50.0000 mg | ORAL_TABLET | Freq: Every day | ORAL | Status: DC
  Administered 2016-09-08 – 2016-09-11 (×4): 50 mg via ORAL
  Filled 2016-09-07 (×5): qty 1

## 2016-09-07 MED ORDER — ONDANSETRON HCL 4 MG PO TABS
4.0000 mg | ORAL_TABLET | Freq: Four times a day (QID) | ORAL | Status: DC | PRN
Start: 2016-09-07 — End: 2016-09-13

## 2016-09-07 MED ORDER — CEFAZOLIN SODIUM-DEXTROSE 1-4 GM/50ML-% IV SOLN
1.0000 g | Freq: Once | INTRAVENOUS | Status: AC
  Administered 2016-09-07: 1 g via INTRAVENOUS
  Filled 2016-09-07: qty 50

## 2016-09-07 MED ORDER — CHLORHEXIDINE GLUCONATE CLOTH 2 % EX PADS
6.0000 | MEDICATED_PAD | Freq: Every day | CUTANEOUS | Status: AC
  Administered 2016-09-08 – 2016-09-12 (×5): 6 via TOPICAL

## 2016-09-07 MED ORDER — SODIUM CHLORIDE 0.9% FLUSH
3.0000 mL | Freq: Two times a day (BID) | INTRAVENOUS | Status: DC
  Administered 2016-09-07 – 2016-09-08 (×3): 3 mL via INTRAVENOUS

## 2016-09-07 MED ORDER — ONDANSETRON HCL 4 MG/2ML IJ SOLN: 4.0000 mg | Freq: Four times a day (QID) | INTRAMUSCULAR | Status: DC | PRN

## 2016-09-07 MED ORDER — ACETAMINOPHEN 650 MG RE SUPP
650.0000 mg | Freq: Four times a day (QID) | RECTAL | Status: DC | PRN
Start: 2016-09-07 — End: 2016-09-13

## 2016-09-07 MED ORDER — ACETAMINOPHEN 325 MG PO TABS
650.0000 mg | ORAL_TABLET | Freq: Four times a day (QID) | ORAL | Status: DC | PRN
  Administered 2016-09-12 – 2016-09-13 (×2): 650 mg via ORAL
  Filled 2016-09-07 (×2): qty 2

## 2016-09-07 MED ORDER — BISACODYL 5 MG PO TBEC: 5.0000 mg | DELAYED_RELEASE_TABLET | Freq: Every day | ORAL | Status: DC | PRN

## 2016-09-07 MED ORDER — HYDROCODONE-ACETAMINOPHEN 5-325 MG PO TABS: 1.0000 | ORAL_TABLET | ORAL | Status: DC | PRN

## 2016-09-07 MED ORDER — LORAZEPAM 0.5 MG PO TABS: 0.5000 mg | ORAL_TABLET | Freq: Four times a day (QID) | ORAL | Status: DC | PRN

## 2016-09-07 MED ORDER — SENNOSIDES-DOCUSATE SODIUM 8.6-50 MG PO TABS
1.0000 | ORAL_TABLET | Freq: Every evening | ORAL | Status: DC | PRN
Start: 2016-09-07 — End: 2016-09-13

## 2016-09-07 MED ORDER — PRAVASTATIN SODIUM 20 MG PO TABS
40.0000 mg | ORAL_TABLET | Freq: Every day | ORAL | Status: DC
  Administered 2016-09-07 – 2016-09-12 (×5): 40 mg via ORAL
  Filled 2016-09-07 (×5): qty 2

## 2016-09-07 MED ORDER — MUPIROCIN 2 % EX OINT
1.0000 "application " | TOPICAL_OINTMENT | Freq: Two times a day (BID) | CUTANEOUS | Status: AC
  Administered 2016-09-07 – 2016-09-12 (×10): 1 via NASAL
  Filled 2016-09-07: qty 22

## 2016-09-07 MED ORDER — ALBUTEROL SULFATE (2.5 MG/3ML) 0.083% IN NEBU: 2.5000 mg | INHALATION_SOLUTION | RESPIRATORY_TRACT | Status: DC | PRN

## 2016-09-07 NOTE — H&P (Signed)
[facility] [place] [facility]   PATIENT NAME: Heather CoveHilda Haseley    MR#:  [id number]  DATE OF BIRTH:  02/29/1922  DATE OF ADMISSION:  09/07/2016  PRIMARY CARE PHYSICIAN: System, Pcp Not In   REQUESTING/REFERRING PHYSICIAN: Jeanmarie PlantMcShane, James A, MD  CHIEF COMPLAINT:   Chief Complaint  Patient presents with  . Skin Ulcer   Skin Ulcer HISTORY OF PRESENT ILLNESS:  Heather Curtis  is a 83 y.o. female with a known history of Hypertension, diabetes, CKD and dementia. The patient was sent from nursing home to the ED due to decubital ulcers, which is quite widespread. The ulcers are stage II with infection. It is unclear how long these ulcers have been there. Dr. Rexford MausMacShane started antibiotics for DU ulcer infection.  PAST MEDICAL HISTORY:   Past Medical History:  Diagnosis Date  . CKD (chronic kidney disease)   . Dementia   . Diabetes mellitus without complication (HCC)   . Hypertension     PAST SURGICAL HISTORY:   Past Surgical History:  Procedure Laterality Date  . ABDOMINAL HYSTERECTOMY    . CHOLECYSTECTOMY      SOCIAL HISTORY:   Social History  Substance Use Topics  . Smoking status: Never Smoker  . Smokeless tobacco: Never Used  . Alcohol use No    FAMILY HISTORY:  History reviewed. No pertinent family history.  DRUG ALLERGIES:   Allergies  Allergen Reactions  . Ace Inhibitors Cough  . Ciprofloxacin Other (See Comments)  . Other Other (See Comments)  . Penicillins Rash  . Sulfa Antibiotics Itching, Rash and Other (See Comments)    REVIEW OF SYSTEMS:   Review of Systems  Unable to perform ROS: Dementia    MEDICATIONS AT HOME:   Prior to Admission medications   Medication Sig Start Date End Date Taking? Authorizing Provider  donepezil (ARICEPT) 10 MG tablet Take 10 mg by mouth 3 (three) times daily.    Yes [provider]  glimepiride (AMARYL) 2 MG tablet Take 3 mg by mouth daily with breakfast.   Yes [provider]    insulin detemir (LEVEMIR) 100 UNIT/ML injection Inject 22 Units into the skin daily.    Yes [provider]  insulin lispro (HUMALOG) 100 UNIT/ML injection Inject 2-4 Units into the skin 3 (three) times daily before meals. 2 units qam, 4 units @ noon, and 2 units qpm   Yes [provider]  LORazepam (ATIVAN) 0.5 MG tablet Take 0.5 mg by mouth every 6 (six) hours as needed for anxiety.   Yes [provider]  losartan (COZAAR) 50 MG tablet Take 50 mg by mouth daily.   Yes [provider]  pravastatin (PRAVACHOL) 40 MG tablet Take 40 mg by mouth at bedtime.    Yes [provider]      VITAL SIGNS:  Blood pressure (!) 160/75, pulse 74, temperature 98.4 F (36.9 C), temperature source Oral, resp. rate 16, SpO2 97 %.  PHYSICAL EXAMINATION:  Physical Exam  GENERAL:  83 y.o.-year-old patient lying in the bed with no acute distress. EYES: Pupils equal, round, reactive to light and accommodation. No scleral icterus. Extraocular muscles intact.  HEENT: Head atraumatic, normocephalic. Oropharynx and nasopharynx clear.  NECK:  Supple, no jugular venous distention. No thyroid enlargement, no tenderness.  LUNGS: Normal breath sounds bilaterally, no wheezing, rales,rhonchi or crepitation. No use of accessory muscles of respiration.  CARDIOVASCULAR: S1, S2 normal. No murmurs, rubs, or gallops.  ABDOMEN: Soft, nontender, nondistended. Bowel  sounds present. No organomegaly or mass.  EXTREMITIES: No pedal edema, cyanosis, or clubbing.  NEUROLOGIC: Unable to exam. PSYCHIATRIC: The patient is demented. SKIN:   large area of redness, swelling and multiple stage II ulcers in decubitus area.    LABORATORY PANEL:   CBC  Recent Labs Lab 09/07/16 1117  WBC 12.3*  HGB 12.8  HCT 38.2  PLT 275   ------------------------------------------------------------------------------------------------------------------  Chemistries   Recent Labs Lab 09/07/16 1117   NA 140  K 4.2  CL 107  CO2 25  GLUCOSE 109*  BUN 27*  CREATININE 1.81*  CALCIUM 8.7*   ------------------------------------------------------------------------------------------------------------------  Cardiac Enzymes  Recent Labs Lab 09/07/16 1117  TROPONINI <0.03   ------------------------------------------------------------------------------------------------------------------  RADIOLOGY:  Dg Chest Port 1 View  Result Date: 09/07/2016 CLINICAL DATA:  CKD, diabetic with multiple skin ulcers, non smoker EXAM: PORTABLE CHEST - 1 VIEW COMPARISON:  06/30/2016 FINDINGS: Low lung volumes. Diffuse interstitial edema or infiltrates. Largely stable compared to previous exam. No of airspace disease. Heart size upper limits normal.  Atheromatous aorta. No effusion. Visualized bones unremarkable.   Surgical clips right axilla. IMPRESSION: Cardiomegaly and Chronic bilateral interstitial prominence, largely stable. No acute findings. Electronically Signed   By: Corlis Leak  Hassell M.D.   On: 09/07/2016 13:01   Dg Foot Complete Right  Result Date: 09/07/2016 CLINICAL DATA:  Patient with worsening ulcer on the heel of the right foot. EXAM: RIGHT FOOT COMPLETE - 3+ VIEW COMPARISON:  Great toe radiograph 02/20/2015 FINDINGS: Normal anatomic alignment. No evidence for acute fracture or dislocation. Mild midfoot degenerative changes. First MTP joint degenerative changes. Vascular calcifications. No definite osseous cortical destruction along the inferior aspect of the calcaneus to suggest osteomyelitis. IMPRESSION: No definite osseous cortical destruction to suggest osteomyelitis. Electronically Signed   By: Annia Beltrew  Davis M.D.   On: 09/07/2016 12:27      IMPRESSION AND PLAN:   Stage 2 Decubitus ulcer infection with leukocytosis The patient will be admitted to medical floor. The patient is bedbound, turn body every 2 hours. Start clindamycin IV every 8 hours. Wound care consult.  Hypertension. Continue  hypertension medication, IV hydralazine when necessary Diabetes. Start sliding scale and continue Levemir. Dementia. Aspiration precaution. Continue dementia medication. CKD stage 4. Stable.  All the records are reviewed and case discussed with ED provider. Management plans discussed with the patient's daughter and they are in agreement.  CODE STATUS: DO NOT RESUSCITATE  TOTAL TIME TAKING CARE OF THIS PATIENT: 53 minutes.    Shaune Pollackhen, Fritzie Prioleau M.D on 09/07/2016 at 3:45 PM  Between 7am to 6pm - Pager - [phone]  After 6pm go to [email or web address] - Social research officer, [email or web address] [email or web address]  [facility] [place] [facility]  Office  [phone]  CC: Primary care physician; System, Pcp Not In   Note: This dictation was prepared with Dragon dictation along with smaller phrase technology. Any transcriptional errors that result from this process are unintentional.

## 2016-09-07 NOTE — ED Notes (Signed)
Pt noted to have pressure wounds to sacrum and R heel.  Right heel: unstageable, covered with hard yellow/black eschar. Periwound does not appear to be reddened or hot to touch at this time.   Sacrum: multiple scattered small pressure wounds with white/yellow wound base along with abrasions, like MASD.

## 2016-09-07 NOTE — ED Provider Notes (Signed)
[facility] Emergency Department Provider Note  ____________________________________________   I have reviewed the triage vital signs and the nursing notes.   HISTORY  Chief Complaint Skin Ulcer    HPI Heather Curtis is a 83 y.o. female who is bedbound, she presents with 2 complaints. She is quite demented at baseline cannot give a history, history is per family and per nursing home. Patient is noted to have a decubital ulcer on her bottom which is quite widespread. Is unclear how long he has been there. She also has a decubitus ulcer on her left heel which is been there for an indeterminate amount of time. The concern is that the decubiti are getting worse. No fever or vomiting. Level 5 chart caveat; no further history available due to patient status.    Past Medical History:  Diagnosis Date  . CKD (chronic kidney disease)   . Dementia   . Diabetes mellitus without complication (HCC)   . Hypertension     Patient Active Problem List   Diagnosis Date Noted  . Bacteremia 02/18/2016  . Dehydration 02/17/2016    Past Surgical History:  Procedure Laterality Date  . ABDOMINAL HYSTERECTOMY    . CHOLECYSTECTOMY      Prior to Admission medications   Medication Sig Start Date End Date Taking? Authorizing Provider  donepezil (ARICEPT) 10 MG tablet Take 10 mg by mouth 3 (three) times daily.    Yes [provider]  glimepiride (AMARYL) 2 MG tablet Take 3 mg by mouth daily with breakfast.   Yes [provider]  insulin detemir (LEVEMIR) 100 UNIT/ML injection Inject 22 Units into the skin daily.    Yes [provider]  insulin lispro (HUMALOG) 100 UNIT/ML injection Inject 2-4 Units into the skin 3 (three) times daily before meals. 2 units qam, 4 units @ noon, and 2 units qpm   Yes [provider]  LORazepam (ATIVAN) 0.5 MG tablet Take 0.5 mg by mouth every 6 (six) hours as needed for anxiety.   Yes [provider]   losartan (COZAAR) 50 MG tablet Take 50 mg by mouth daily.   Yes [provider]  pravastatin (PRAVACHOL) 40 MG tablet Take 40 mg by mouth at bedtime.    Yes [provider]    Allergies Ace inhibitors; Ciprofloxacin; Other; Penicillins; and Sulfa antibiotics  History reviewed. No pertinent family history.  Social History Social History  Substance Use Topics  . Smoking status: Never Smoker  . Smokeless tobacco: Never Used  . Alcohol use No    Review of Systems Level 5 chart caveat; no further history available due to patient status.  ____________________________________________   PHYSICAL EXAM:  VITAL SIGNS: ED Triage Vitals [09/07/16 1059]  Enc Vitals Group     BP (!) 170/80     Pulse Rate 85     Resp 18     Temp 98.4 F (36.9 C)     Temp Source Oral     SpO2 98 %     Weight      Height      Head Circumference      Peak Flow      Pain Score      Pain Loc      Pain Edu?      Excl. in GC?     Constitutional: Alert andPleasantly demented in no acute distress Eyes: Conjunctivae are normal Head: Atraumatic HEENT: No congestion/rhinnorhea. Mucous membranes are moist.  Oropharynx non-erythematous Neck:  Nontender with no meningismus, no masses, no stridor Cardiovascular: Normal rate, regular rhythm. Grossly normal heart sounds.  Good peripheral circulation. Respiratory: Normal respiratory effort.  No retractions. Lungs CTAB. Abdominal: Soft and nontender. No distention. No guarding no rebound Back:  There is no focal tenderness or step off.  there is no midline tenderness there are no lesions noted. there is no CVA tenderness  Musculoskeletal: No lower extremity tenderness, no upper extremity tenderness. No joint effusions, no DVT signs strong distal pulses no edema Neurologic:  Normal speech and language. No gross focal neurologic deficits are appreciated.  Skin:  Skin is warm, dry and intact. Very diffuse excoriated erythematous purulent area  covering both buttocks noted. In addition, there is a scab about the size of a quarter on the left foot with no fluctuants crepitus erythema and she has strong distal pulses are   ____________________________________________   LABS (all labs ordered are listed, but only abnormal results are displayed)  Labs Reviewed  BASIC METABOLIC PANEL - Abnormal; Notable for the following:       Result Value   Glucose, Bld 109 (*)    BUN 27 (*)    Creatinine, Ser 1.81 (*)    Calcium 8.7 (*)    GFR calc non Af Amer 23 (*)    GFR calc Af Amer 27 (*)    All other components within normal limits  CBC - Abnormal; Notable for the following:    WBC 12.3 (*)    All other components within normal limits  BRAIN NATRIURETIC PEPTIDE - Abnormal; Notable for the following:    B Natriuretic Peptide 218.0 (*)    All other components within normal limits  TROPONIN I   ____________________________________________  EKG  I personally interpreted any EKGs ordered by me or triage  ____________________________________________  RADIOLOGY  I reviewed any imaging ordered by me or triage that were performed during my shift and, if possible, patient and/or family made aware of any abnormal findings. ____________________________________________   PROCEDURES  Procedure(s) performed: None  Procedures  Critical Care performed: None  ____________________________________________   INITIAL IMPRESSION / ASSESSMENT AND PLAN / ED COURSE  Pertinent labs & imaging results that were available during my care of the patient were reviewed by me and considered in my medical decision making (see chart for details).  Patient with diffuse likely infected yet shallow decubitus ulcer has components of cellulitis to her bottom. White count reassuring. She also has a decubitus ulcer to her foot with good pulses and no evidence of osteomyelitis. She likely would benefit from better wound care and probably antibiotics for her  bottom. We will admit for further care.    ____________________________________________   FINAL CLINICAL IMPRESSION(S) / ED DIAGNOSES  Final diagnoses:  SOB (shortness of breath)  Wound infection      This chart was dictated using voice recognition software.  Despite best efforts to proofread,  errors can occur which can change meaning.      Jeanmarie PlantMcShane, Taige Housman A, MD 09/07/16 [phone]

## 2016-09-07 NOTE — Progress Notes (Signed)
Pt has yellow (fall); red (allergies) and purple (DNR) armbands on

## 2016-09-07 NOTE — ED Triage Notes (Signed)
Pt to ED via [facility] from [facility] c/o multiple ulcers. [facility] patient has multiple ulcers in sacral region and right heel that are becoming progressively worse. Pt alert and oriented to self only, in no acute distress at this time.

## 2016-09-08 DIAGNOSIS — L89152 Pressure ulcer of sacral region, stage 2: Secondary | ICD-10-CM | POA: Diagnosis not present

## 2016-09-08 DIAGNOSIS — R0602 Shortness of breath: Secondary | ICD-10-CM | POA: Diagnosis not present

## 2016-09-08 LAB — GLUCOSE, CAPILLARY
GLUCOSE-CAPILLARY: 155 mg/dL — AB (ref 65–99)
GLUCOSE-CAPILLARY: 152 mg/dL — AB (ref 65–99)
Glucose-Capillary: 131 mg/dL — ABNORMAL HIGH (ref 65–99)
Glucose-Capillary: 169 mg/dL — ABNORMAL HIGH (ref 65–99)

## 2016-09-08 LAB — BASIC METABOLIC PANEL
ANION GAP: 6 (ref 5–15)
BUN: 29 mg/dL — ABNORMAL HIGH (ref 6–20)
CHLORIDE: 109 mmol/L (ref 101–111)
CO2: 24 mmol/L (ref 22–32)
CREATININE: 1.68 mg/dL — AB (ref 0.44–1.00)
Calcium: 8.5 mg/dL — ABNORMAL LOW (ref 8.9–10.3)
GFR calc Af Amer: 29 mL/min — ABNORMAL LOW (ref >60)
GFR calc non Af Amer: 25 mL/min — ABNORMAL LOW (ref >60)
GLUCOSE: 128 mg/dL — AB (ref 65–99)
POTASSIUM: 4.1 mmol/L (ref 3.5–5.1)
Sodium: 139 mmol/L (ref 135–145)

## 2016-09-08 LAB — CBC
HCT: 34 % — ABNORMAL LOW (ref 35.0–47.0)
Hemoglobin: 11.4 g/dL — ABNORMAL LOW (ref 12.0–16.0)
MCH: 28.7 pg (ref 26.0–34.0)
MCHC: 33.6 g/dL (ref 32.0–36.0)
MCV: 85.2 fL (ref 80.0–100.0)
Platelets: 253 10*3/uL (ref 150–440)
RBC: 3.99 MIL/uL (ref 3.80–5.20)
RDW: 13.8 % (ref 11.5–14.5)
WBC: 11 10*3/uL (ref 3.6–11.0)

## 2016-09-08 MED ORDER — FLUCONAZOLE 100 MG PO TABS
100.0000 mg | ORAL_TABLET | Freq: Every day | ORAL | Status: DC
  Administered 2016-09-08 – 2016-09-13 (×6): 100 mg via ORAL
  Filled 2016-09-08 (×6): qty 1

## 2016-09-08 MED ORDER — GERHARDT'S BUTT CREAM
TOPICAL_CREAM | Freq: Three times a day (TID) | CUTANEOUS | Status: DC
  Administered 2016-09-08 – 2016-09-12 (×12): via TOPICAL
  Administered 2016-09-12 (×2): 1 via TOPICAL
  Administered 2016-09-13: 09:00:00 via TOPICAL
  Filled 2016-09-08 (×2): qty 1

## 2016-09-08 NOTE — Clinical Social Work Note (Signed)
Clinical Social Work Assessment  Patient Details  Name: Heather Curtis MRN: [id number] Date of Birth: February 25, 1922  Date of referral:  09/08/16               Reason for consult:  Facility Placement                Permission sought to share information with:  Facility Industrial/product designerContact Representative Permission granted to share information::  Yes, Verbal Permission Granted  Name::        Agency::     Relationship::     Contact Information:     Housing/Transportation Living arrangements for the past 2 months:  Assisted Living Facility Source of Information:  Medical Team, Adult Children, Facility Patient Interpreter Needed:  None Criminal Activity/Legal Involvement Pertinent to Current Situation/Hospitalization:  No - Comment as needed Significant Relationships:  Adult Children Lives with:  Facility Resident Do you feel safe going back to the place where you live?  Yes Need for family participation in patient care:  Yes (Comment) (Patient has advanced dementia.)  Care giving concerns:  Patient admitted from ALF   Social Worker assessment / plan:  The CSW attempted to meet with the patient and family at bedside. The patient was sleeping soundly, and no family or visitors were at bedside. The CSW contacted the patient's son, Heather Curtis, for information. Homero FellersFrank confirmed that his mother resides in [facility] and will return when stable. The patient is baseline incontinent of bladder and bowel, and she has advanced dementia. The patient is bed bound and dependent with all ADLs.  According to [facility], the patient can return when she is stable. The discharge will be in the next 1-3 days depending on the progress of her pressure wounds. The patient will require EMS to transport. CSW will continue to follow for discharge facilitation.  Employment status:  Retired Database administratornsurance information:  Managed Medicare PT Recommendations:  Not assessed at this time Information / Referral to  community resources:     Patient/Family's Response to care:  The patient was sleeping. The patient's son thanked the CSW.  Patient/Family's Understanding of and Emotional Response to Diagnosis, Current Treatment, and Prognosis:  The patient has advanced dementia. The patient's son was pleasant and concerned about his mother. The patient's family is in agreement with the treatment plan and discharge plan.  Emotional Assessment Appearance:  Appears stated age Attitude/Demeanor/Rapport:  Lethargic Affect (typically observed):   (Patient was sleeping) Orientation:   (The patient is baseline confused with advanced dementia.) Alcohol / Substance use:  Never Used Psych involvement (Current and /or in the community):  No (Comment)  Discharge Needs  Concerns to be addressed:  Care Coordination, Discharge Planning Concerns Readmission within the last 30 days:  No Current discharge risk:  Cognitively Impaired, Chronically ill Barriers to Discharge:  Continued Medical Work up   [employer] CorporationKaren M Lonni Dirden, LCSW 09/08/2016, 4:04 PM

## 2016-09-08 NOTE — Progress Notes (Signed)
Pt has on red (allergies); yellow (fall) and purple (DNR) armbands

## 2016-09-08 NOTE — Progress Notes (Signed)
Telephone call to [facility] [phone] in order to obtain clarification of pt's last bowel movement; pt was incontinent of stool on 09/06/2016

## 2016-09-08 NOTE — Progress Notes (Signed)
Yellow, Red and purple arm bands in place

## 2016-09-08 NOTE — Progress Notes (Signed)
Pt. Has on red, yellow and purple precaution bands.  

## 2016-09-08 NOTE — Progress Notes (Signed)
[employer] [facility] - [place] at [facility]   PATIENT NAME: Heather Curtis    MR#:  [id number]  DATE OF BIRTH:  1923-01-10  SUBJECTIVE:  CHIEF COMPLAINT:   Chief Complaint  Patient presents with  . Skin Ulcer   Advanced dementia and bed bound. History obtained from daughter.  REVIEW OF SYSTEMS:    Review of Systems  Unable to perform ROS: Dementia    DRUG ALLERGIES:   Allergies  Allergen Reactions  . Ace Inhibitors Cough  . Ciprofloxacin Other (See Comments)  . Other Other (See Comments)  . Penicillins Rash  . Sulfa Antibiotics Itching, Rash and Other (See Comments)    VITALS:  Blood pressure (!) 160/62, pulse 79, temperature 98.8 F (37.1 C), temperature source Oral, resp. rate 20, SpO2 96 %.  PHYSICAL EXAMINATION:   Physical Exam  GENERAL:  83 y.o.-year-old patient lying in the bed with no acute distress.  HEENT: Head atraumatic, normocephalic. Oropharynx and nasopharynx clear.  NECK:  Supple, no jugular venous distention. No thyroid enlargement, no tenderness.  LUNGS: Normal breath sounds bilaterally, no wheezing, rales, rhonchi. No use of accessory muscles of respiration.  CARDIOVASCULAR: S1, S2 normal. No murmurs, rubs, or gallops.  ABDOMEN: Soft, nontender, nondistended. Bowel sounds present. EXTREMITIES: No cyanosis, clubbing or edema b/l.    NEUROLOGIC: does not follow instructions PSYCHIATRIC: The patient is alert and awake. SKIN: Sacral decubitus ulcer. Right heel ulcer  LABORATORY PANEL:   CBC  Recent Labs Lab 09/08/16 0425  WBC 11.0  HGB 11.4*  HCT 34.0*  PLT 253   ------------------------------------------------------------------------------------------------------------------ Chemistries   Recent Labs Lab 09/08/16 0425  NA 139  K 4.1  CL 109  CO2 24  GLUCOSE 128*  BUN 29*  CREATININE 1.68*  CALCIUM 8.5*    ------------------------------------------------------------------------------------------------------------------  Cardiac Enzymes  Recent Labs Lab 09/07/16 1117  TROPONINI <0.03   ------------------------------------------------------------------------------------------------------------------  RADIOLOGY:  Dg Chest Port 1 View  Result Date: 09/07/2016 CLINICAL DATA:  CKD, diabetic with multiple skin ulcers, non smoker EXAM: PORTABLE CHEST - 1 VIEW COMPARISON:  06/30/2016 FINDINGS: Low lung volumes. Diffuse interstitial edema or infiltrates. Largely stable compared to previous exam. No of airspace disease. Heart size upper limits normal.  Atheromatous aorta. No effusion. Visualized bones unremarkable.   Surgical clips right axilla. IMPRESSION: Cardiomegaly and Chronic bilateral interstitial prominence, largely stable. No acute findings. Electronically Signed   By: Corlis Leak  Hassell M.D.   On: 09/07/2016 13:01   Dg Foot Complete Right  Result Date: 09/07/2016 CLINICAL DATA:  Patient with worsening ulcer on the heel of the right foot. EXAM: RIGHT FOOT COMPLETE - 3+ VIEW COMPARISON:  Great toe radiograph 02/20/2015 FINDINGS: Normal anatomic alignment. No evidence for acute fracture or dislocation. Mild midfoot degenerative changes. First MTP joint degenerative changes. Vascular calcifications. No definite osseous cortical destruction along the inferior aspect of the calcaneus to suggest osteomyelitis. IMPRESSION: No definite osseous cortical destruction to suggest osteomyelitis. Electronically Signed   By: Annia Beltrew  Davis M.D.   On: 09/07/2016 12:27     ASSESSMENT AND PLAN:   * Sacral decubitus ulcer with erythema ON IV abx. Appreciate wound care team help Add PO fluconazole Air mattress  * Right heel ulcer Healing  * DM SSI  * Dementia Watch for inpatient delirium  * DVT prophylaxis Lovenox  All the records are reviewed and case discussed with Care Management/Social  Worker Management plans discussed with the patient, family and they are in agreement.  CODE STATUS: DNR  DVT Prophylaxis: SCDs  TOTAL TIME TAKING CARE OF THIS PATIENT: 30 minutes.   POSSIBLE D/C IN 1-2 DAYS, DEPENDING ON CLINICAL CONDITION.  Milagros LollSudini, Casee Knepp R M.D on 09/08/2016 at 11:55 AM  Between 7am to 6pm - Pager - [phone]  After 6pm go to [email or web address] - password [email or web address]  [facility]  Office  [phone]  CC: Primary care physician; System, Pcp Not In  Note: This dictation was prepared with Dragon dictation along with smaller phrase technology. Any transcriptional errors that result from this process are unintentional.

## 2016-09-08 NOTE — Consult Note (Signed)
WOC Nurse wound consult note Reason for Consult: Moisture associated skin damage (MASD) to buttocks, posterior and medial thighs, Unstageable wound to right heel (Healing Stage 3 after debridement). Wound type: Moisture (specifically, incontinence associated dermatitis (IAD) to buttocks, posterior and medial thighs.  Scattered partial thickness open areas embedded within in the erythematous (blanching) area, the largest of which measures 0.5cm x 0.8cm x 0.1cm.  Scant serous exudate. Surrounding tissue is macerated and with odor consistent with urine.  External collection device (PurWick) in place. Pressure Injury POA: Yes Measurement:Right heel with 3cm x 5cm dried and loose eschar.  This is debrided (see below) to reveal a healing Stage 3 pressure injury measuring 0.5cm x 3cm x 0.1cm. Scant serous exudate, no odor.  No surrounding erythema, induration of warmth. Wound bed:As described above Drainage (amount, consistency, odor) As described above Periwound:As described above Dressing procedure/placement/frequency: Patient is on a mattress replacement with low air loss feature for correction of microclimate.  Additionally an external female urinary collection device (PurWick) is in place.  Guidance is provided for the Nursing staff via the orders for turning and repositioning, also for linen use while on a therapeutic sleep surface. For the extensive long-standing fungal infection to the buttocks, posterior and medial thighs, I have provided a topical agent using a compounded formula of antifungal (lotrimin), hydrocortisone and zinc oxide.  To augment this I suggest several pulsed doses of IV or oral antifungal (eg., Diflucan).  If you agree, please order. Conservative sharp wound debridement (CSWD performed at the bedside): Loose eschar from posterior right heel debrided to reveal healing Stage 3 pressure injury using scissors and pickups.  No bleeding, no discomfort noted from patient. WOC nursing team  will not follow, but will remain available to this patient, the nursing and medical teams.  Please re-consult if needed. Thanks, Ladona MowLaurie Niya Behler, MSN, RN, GNP, Hans EdenCWOCN, CWON-AP, FAAN  Pager# [phone]

## 2016-09-09 DIAGNOSIS — R0602 Shortness of breath: Secondary | ICD-10-CM | POA: Diagnosis not present

## 2016-09-09 DIAGNOSIS — L89152 Pressure ulcer of sacral region, stage 2: Secondary | ICD-10-CM | POA: Diagnosis not present

## 2016-09-09 LAB — GLUCOSE, CAPILLARY
GLUCOSE-CAPILLARY: 123 mg/dL — AB (ref 65–99)
GLUCOSE-CAPILLARY: 131 mg/dL — AB (ref 65–99)
GLUCOSE-CAPILLARY: 155 mg/dL — AB (ref 65–99)
GLUCOSE-CAPILLARY: 78 mg/dL (ref 65–99)
Glucose-Capillary: 142 mg/dL — ABNORMAL HIGH (ref 65–99)

## 2016-09-09 MED ORDER — BISACODYL 10 MG RE SUPP
10.0000 mg | Freq: Once | RECTAL | Status: AC
  Administered 2016-09-09: 16:00:00 10 mg via RECTAL
  Filled 2016-09-09: qty 1

## 2016-09-09 MED ORDER — CLOTRIMAZOLE 1 % EX CREA: 1.0000 "application " | TOPICAL_CREAM | Freq: Two times a day (BID) | CUTANEOUS | 0 refills | Status: AC

## 2016-09-09 MED ORDER — CLINDAMYCIN HCL 300 MG PO CAPS: 300.0000 mg | ORAL_CAPSULE | Freq: Three times a day (TID) | ORAL | 0 refills | Status: AC

## 2016-09-09 MED ORDER — LOSARTAN POTASSIUM 100 MG PO TABS: 100.0000 mg | ORAL_TABLET | Freq: Every day | ORAL | 0 refills | Status: AC

## 2016-09-09 MED ORDER — CLINDAMYCIN HCL 150 MG PO CAPS
300.0000 mg | ORAL_CAPSULE | Freq: Three times a day (TID) | ORAL | Status: DC
  Administered 2016-09-09 – 2016-09-13 (×12): 300 mg via ORAL
  Filled 2016-09-09: qty 2
  Filled 2016-09-09: qty 1
  Filled 2016-09-09 (×3): qty 2
  Filled 2016-09-09: qty 1
  Filled 2016-09-09 (×3): qty 2
  Filled 2016-09-09: qty 1
  Filled 2016-09-09: qty 2
  Filled 2016-09-09: qty 1
  Filled 2016-09-09: qty 2

## 2016-09-09 MED ORDER — GERHARDT'S BUTT CREAM: 1.0000 "application " | TOPICAL_CREAM | Freq: Three times a day (TID) | CUTANEOUS | 0 refills | Status: AC

## 2016-09-09 MED ORDER — FLUCONAZOLE 100 MG PO TABS: 100.0000 mg | ORAL_TABLET | Freq: Every day | ORAL | 0 refills | Status: AC

## 2016-09-09 NOTE — Discharge Instructions (Addendum)
Wound care to right posterior heel: Cleanse with NS, pat gently dry. Cover Stage 3 pressure Injury with xeroform gauze Hart [place] [phone]) , top with dry gauze 4x4s and secure with Kerlix roll gauze/paper tape. Change twice daily. Place foot/feet into Prevalon boots for pressure redistribution.  Needs repositioning every 2 hrs.  Apply Lotrimin cream and Butt cream as on prescriptions.  Follow up with PCP within a week

## 2016-09-09 NOTE — Clinical Social Work Note (Signed)
CS[facility] spoke to [facility] and they will come to evaluate pt's skin to ensure is able to return safely. As a back up, CSW sent out referral to SNF for wound care. CSW updated pt's son and daughter. MD is aware as well as Charity fundraiserN. CSW will continue to follow.   Heather Curtis, MSW, LCSW  Clinical Social Worker  [phone]

## 2016-09-09 NOTE — Discharge Summary (Addendum)
[employer] - [place] at [facility]   PATIENT NAME: Heather Curtis    MR#:  [id number]  DATE OF BIRTH:  07-27-1922  DATE OF ADMISSION:  09/07/2016 ADMITTING PHYSICIAN: Shaune Pollack, MD  DATE OF DISCHARGE: 09/11/2016  PRIMARY CARE PHYSICIAN: System, Pcp Not In   ADMISSION DIAGNOSIS:  SOB (shortness of breath) [R06.02] Wound infection [T14.8XXA, L08.9]  DISCHARGE DIAGNOSIS:  Active Problems:   Decubitus ulcer, stage 2 with infection   SECONDARY DIAGNOSIS:   Past Medical History:  Diagnosis Date  . CKD (chronic kidney disease)   . Dementia   . Diabetes mellitus without complication (HCC)   . Hypertension      ADMITTING HISTORY  HISTORY OF PRESENT ILLNESS:  Heather Curtis  is a 83 y.o. female with a known history of Hypertension, diabetes, CKD and dementia. The patient was sent from nursing home to the ED due to decubital ulcers, which is quite widespread. The ulcers are stage II with infection. It is unclear how long these ulcers have been there. Dr. Rexford Maus started antibiotics for DU ulcer infection.   HOSPITAL COURSE:    * Sacral decubitus ulcer with cellulitis. Possible yeast. ON IV abx in hospital. Switch to PO clindamycin for 1 week. Seen by wound care team Added PO fluconazole for 1 week Air mattress Needs to be Turned every 2 hrs. Zinc oxide and lotrimin cream application  Cx from superficial skin. No discharge. Not reliable. But patient does have cellulitis and needed IV antibiotics.  * Right heel ulcer Healing  * DM Insulin. Well controlled  * HTN Losartan dose increased  * Dementia stable  Discharge back to [facility]  Wound care to right posterior heel: Cleanse with NS, pat gently dry. Cover Stage 3 pressure Injury with xeroform gauze [place] [phone]) , top with dry gauze 4x4s and secure with Kerlix roll gauze/paper tape. Change twice daily. Place foot/feet into Prevalon boots for pressure redistribution.  Needs repositioning  every 2 hrs.  Apply Lotrimin cream and Butt cream twice daily.  CONSULTS OBTAINED:   Wound care  DRUG ALLERGIES:   Allergies  Allergen Reactions  . Ace Inhibitors Cough  . Ciprofloxacin Other (See Comments)  . Other Other (See Comments)  . Penicillins Rash  . Sulfa Antibiotics Itching, Rash and Other (See Comments)    DISCHARGE MEDICATIONS:   Current Discharge Medication List    START taking these medications   Details  clindamycin (CLEOCIN) 300 MG capsule Take 1 capsule (300 mg total) by mouth 3 (three) times daily. Qty: 21 capsule, Refills: 0    clotrimazole (LOTRIMIN AF) 1 % cream Apply 1 application topically 2 (two) times daily. Apply to buttock wound Qty: 60 g, Refills: 0    fluconazole (DIFLUCAN) 100 MG tablet Take 1 tablet (100 mg total) by mouth daily. Qty: 7 tablet, Refills: 0    Hydrocortisone (GERHARDT'S BUTT CREAM) CREA Apply 1 application topically 3 (three) times daily. Apply in a thin payer to buttocks, posterior and medial thighs three times daily after cleansing with house skin cleanser. Turn patient side to side and minimize time spent in the supine position. Use only one underpad beneath patient. Qty: 1 each, Refills: 0      CONTINUE these medications which have CHANGED   Details  LORazepam (ATIVAN) 0.5 MG tablet Take 1 tablet (0.5 mg total) by mouth every 8 (eight) hours as needed for anxiety. Qty: 10 tablet, Refills: 0    losartan (COZAAR) 100 MG tablet Take 1 tablet (100  mg total) by mouth daily. Qty: 30 tablet, Refills: 0      CONTINUE these medications which have NOT CHANGED   Details  donepezil (ARICEPT) 10 MG tablet Take 10 mg by mouth 3 (three) times daily.     glimepiride (AMARYL) 2 MG tablet Take 3 mg by mouth daily with breakfast.    insulin detemir (LEVEMIR) 100 UNIT/ML injection Inject 22 Units into the skin daily.     insulin lispro (HUMALOG) 100 UNIT/ML injection Inject 2-4 Units into the skin 3 (three) times daily before  meals. 2 units qam, 4 units @ noon, and 2 units qpm    pravastatin (PRAVACHOL) 40 MG tablet Take 40 mg by mouth at bedtime.         Today   VITAL SIGNS:  Blood pressure (!) 173/69, pulse 66, temperature 97.8 F (36.6 C), temperature source Oral, resp. rate 17, weight 72.1 kg (159 lb), SpO2 100 %.  I/O:    Intake/Output Summary (Last 24 hours) at 09/11/16 1000 Last data filed at 09/11/16 0640  Gross per 24 hour  Intake                0 ml  Output              150 ml  Net             -150 ml    PHYSICAL EXAMINATION:  Physical Exam  GENERAL:  83 y.o.-year-old patient lying in the bed with no acute distress. LUNGS: Normal breath sounds bilaterally, no wheezing, rales,rhonchi or crepitation. No use of accessory muscles of respiration. CARDIOVASCULAR: S1, S2 normal. No murmurs, rubs, or gallops. ABDOMEN: Soft, non-tender, non-distended. Bowel sounds present. No organomegaly or mass. NEUROLOGIC: Moves all 4 extremities. PSYCHIATRIC: The patient is alert and awake. SKIN: Sacral area with erythema and yeast. Right heel stage 3 ulcer, clean base.  DATA REVIEW:   CBC  Recent Labs Lab 09/08/16 0425  WBC 11.0  HGB 11.4*  HCT 34.0*  PLT 253    Chemistries   Recent Labs Lab 09/08/16 0425  NA 139  K 4.1  CL 109  CO2 24  GLUCOSE 128*  BUN 29*  CREATININE 1.68*  CALCIUM 8.5*    Cardiac Enzymes  Recent Labs Lab 09/07/16 1117  TROPONINI <0.03    Microbiology Results  Results for orders placed or performed during the hospital encounter of 09/07/16  Wound or Superficial Culture     Status: None   Collection Time: 09/07/16  2:33 PM  Result Value Ref Range Status   Specimen Description BUTTOCKS  Final   Special Requests NONE  Final   Gram Stain   Final    MODERATE WBC PRESENT, PREDOMINANTLY PMN FEW SQUAMOUS EPITHELIAL CELLS PRESENT FEW GRAM POSITIVE COCCI IN PAIRS IN CLUSTERS RARE GRAM POSITIVE RODS RARE BUDDING YEAST SEEN Performed at [facility]  [facility], [address] [place], [place] [address]    Culture   Final    ABUNDANT METHICILLIN RESISTANT STAPHYLOCOCCUS AUREUS ABUNDANT ENTEROCOCCUS FAECALIS    Report Status 09/10/2016 FINAL  Final   Organism ID, Bacteria METHICILLIN RESISTANT STAPHYLOCOCCUS AUREUS  Final   Organism ID, Bacteria ENTEROCOCCUS FAECALIS  Final      Susceptibility   Enterococcus faecalis - MIC*    AMPICILLIN <=2 SENSITIVE Sensitive     VANCOMYCIN 1 SENSITIVE Sensitive     GENTAMICIN SYNERGY SENSITIVE Sensitive     * ABUNDANT ENTEROCOCCUS FAECALIS   Methicillin resistant staphylococcus aureus - MIC*  CIPROFLOXACIN >=8 RESISTANT Resistant     ERYTHROMYCIN >=8 RESISTANT Resistant     GENTAMICIN <=0.5 SENSITIVE Sensitive     OXACILLIN >=4 RESISTANT Resistant     TETRACYCLINE <=1 SENSITIVE Sensitive     VANCOMYCIN <=0.5 SENSITIVE Sensitive     TRIMETH/SULFA <=10 SENSITIVE Sensitive     CLINDAMYCIN <=0.25 SENSITIVE Sensitive     RIFAMPIN <=0.5 SENSITIVE Sensitive     Inducible Clindamycin NEGATIVE Sensitive     * ABUNDANT METHICILLIN RESISTANT STAPHYLOCOCCUS AUREUS  MRSA PCR Screening     Status: Abnormal   Collection Time: 09/07/16  4:27 PM  Result Value Ref Range Status   MRSA by PCR POSITIVE (A) NEGATIVE Final    Comment:        The GeneXpert MRSA Assay (FDA approved for NASAL specimens only), is one component of a comprehensive MRSA colonization surveillance program. It is not intended to diagnose MRSA infection nor to guide or monitor treatment for MRSA infections. RESULT CALLED TO, READ BACK BY AND VERIFIED WITH: ROBIN THOMAS ON 09/07/16 AT 1900 [facility]     RADIOLOGY:  No results found.  Follow up with PCP in 1 week.  Management plans discussed with the patient, family and they are in agreement.  CODE STATUS:     Code Status Orders        Start     Ordered   09/07/16 1626  Do not attempt resuscitation (DNR)  Continuous    Question Answer Comment  In the event of cardiac or respiratory  ARREST Do not call a "code blue"   In the event of cardiac or respiratory ARREST Do not perform Intubation, CPR, defibrillation or ACLS   In the event of cardiac or respiratory ARREST Use medication by any route, position, wound care, and other measures to relive pain and suffering. May use oxygen, suction and manual treatment of airway obstruction as needed for comfort.      09/07/16 1625    Code Status History    Date Active Date Inactive Code Status Order ID Comments User Context   02/17/2016  4:58 AM 02/21/2016 11:24 PM DNR [id number]  Ihor AustinPyreddy, Pavan, MD ED    Advance Directive Documentation     Most Recent Value  Type of Advance Directive  Out of facility DNR (pink MOST or yellow form), Healthcare Power of Attorney  Pre-existing out of facility DNR order (yellow form or pink MOST form)  Yellow form placed in chart (order not valid for inpatient use), Physician notified to receive inpatient order  "MOST" Form in Place?  -      TOTAL TIME TAKING CARE OF THIS PATIENT ON DAY OF DISCHARGE: more than 30 minutes.   Milagros LollSudini, Cayde Held R M.D on 09/11/2016 at 10:00 AM  Between 7am to 6pm - Pager - [phone]  After 6pm go to [email or web address] - password [email or web address]  [facility] [place] [facility]  Office  [phone]  CC: Primary care physician; System, Pcp Not In  Note: This dictation was prepared with Dragon dictation along with smaller phrase technology. Any transcriptional errors that result from this process are unintentional.

## 2016-09-09 NOTE — [place] FL2 (Addendum)
[place] MEDICAID FL2 LEVEL OF CARE SCREENING TOOL     IDENTIFICATION  Patient Name: Heather Curtis Birthdate: 07-31-1922 Sex: female Admission Date (Current Location): 09/07/2016  [place]   Facility and Address:  [facility], [address], [place], [place] [address]      Provider Number: [id number]  Attending Physician Name and Address:  Milagros LollSudini, Srikar, MD  Relative Name and Phone Number:       Current Level of Care: Hospital  Recommended Level of Care: SNF Prior Approval Number:    Date Approved/Denied:   PASRR Number: [id number]  Discharge Plan: SNF   Current Diagnoses: Patient Active Problem List   Diagnosis Date Noted  . Decubitus ulcer, stage 2 with infection 09/07/2016  . Bacteremia 02/18/2016  . Dehydration 02/17/2016    Orientation RESPIRATION BLADDER Height & Weight     Self  Normal Incontinent Weight: 159 lb (72.1 kg) Height:     BEHAVIORAL SYMPTOMS/MOOD NEUROLOGICAL BOWEL NUTRITION STATUS      Incontinent Diet (Heart Heaty/Carb Modified, Thin Liquids)  AMBULATORY STATUS COMMUNICATION OF NEEDS Skin   Limited Assist Verbally Normal     PU Stage 3 Dressing: BID (Wound care to right posterior heel:  Cleanse with NS, pat gently dry.  Cover Stage 3 pressure Injury with xeroform gauze [place] [phone]) , top with dry gauze 4x4s and secure with Kerlix roll gauze/paper tape.  Change twice daily. Place foot/feet into Preva)                 Personal Care Assistance Level of Assistance  Bathing, Feeding, Dressing Bathing Assistance: Limited assistance Feeding assistance: Independent Dressing Assistance: Limited assistance     Functional Limitations Info  Sight, Speech, Hearing Sight Info: Adequate   Speech Info: Adequate    SPECIAL CARE FACTORS FREQUENCY                       Contractures Contractures Info: Not present    Additional Factors Info  Code Status, Allergies Code Status  Info: DNR Allergies Info: Ace Inhibitors, Ciprofloxacin, Other, Penicillins, Sulfa Antibiotics           Discharge Medications: Please see discharge summary for a list of discharge medications. Current Discharge Medication List       START taking these medications   Details  clindamycin (CLEOCIN) 300 MG capsule Take 1 capsule (300 mg total) by mouth 3 (three) times daily. Qty: 21 capsule, Refills: 0    clotrimazole (LOTRIMIN AF) 1 % cream Apply 1 application topically 2 (two) times daily. Apply to buttock wound Qty: 60 g, Refills: 0    fluconazole (DIFLUCAN) 100 MG tablet Take 1 tablet (100 mg total) by mouth daily. Qty: 7 tablet, Refills: 0    Hydrocortisone (GERHARDT'S BUTT CREAM) CREA Apply 1 application topically 3 (three) times daily. Apply in a thin payer to buttocks, posterior and medial thighs three times daily after cleansing with house skin cleanser. Turn patient side to side and minimize time spent in the supine position. Use only one underpad beneath patient. Qty: 1 each, Refills: 0         CONTINUE these medications which have CHANGED   Details  losartan (COZAAR) 100 MG tablet Take 1 tablet (100 mg total) by mouth daily. Qty: 30 tablet, Refills: 0         CONTINUE these medications which have NOT CHANGED   Details  donepezil (ARICEPT) 10 MG tablet Take 10  mg by mouth 3 (three) times daily.     glimepiride (AMARYL) 2 MG tablet Take 3 mg by mouth daily with breakfast.    insulin detemir (LEVEMIR) 100 UNIT/ML injection Inject 22 Units into the skin daily.     insulin lispro (HUMALOG) 100 UNIT/ML injection Inject 2-4 Units into the skin 3 (three) times daily before meals. 2 units qam, 4 units @ noon, and 2 units qpm    LORazepam (ATIVAN) 0.5 MG tablet Take 0.5 mg by mouth every 6 (six) hours as needed for anxiety.    pravastatin (PRAVACHOL) 40 MG tablet Take 40 mg by mouth at bedtime.     Relevant Imaging Results:  Relevant Lab  Results:   Additional Information SSN:  [id number]   Pt will have Palliative Care Services  Dede QuerySarah Suttyn Cryder, LCSW

## 2016-09-10 DIAGNOSIS — L89152 Pressure ulcer of sacral region, stage 2: Secondary | ICD-10-CM | POA: Diagnosis not present

## 2016-09-10 DIAGNOSIS — R0602 Shortness of breath: Secondary | ICD-10-CM | POA: Diagnosis not present

## 2016-09-10 LAB — AEROBIC CULTURE W GRAM STAIN (SUPERFICIAL SPECIMEN)

## 2016-09-10 LAB — HEMOGLOBIN A1C
HEMOGLOBIN A1C: 7.8 % — AB (ref 4.8–5.6)
Mean Plasma Glucose: 177 mg/dL

## 2016-09-10 LAB — AEROBIC CULTURE  (SUPERFICIAL SPECIMEN)

## 2016-09-10 LAB — GLUCOSE, CAPILLARY
GLUCOSE-CAPILLARY: 177 mg/dL — AB (ref 65–99)
GLUCOSE-CAPILLARY: 164 mg/dL — AB (ref 65–99)
GLUCOSE-CAPILLARY: 121 mg/dL — AB (ref 65–99)
Glucose-Capillary: 129 mg/dL — ABNORMAL HIGH (ref 65–99)

## 2016-09-10 MED ORDER — LORAZEPAM 0.5 MG PO TABS: 0.5000 mg | ORAL_TABLET | Freq: Three times a day (TID) | ORAL | 0 refills | Status: AC | PRN

## 2016-09-10 NOTE — Progress Notes (Signed)
[facility] - [place] [facility]   PATIENT NAME: Heather [facility]    MR#:  [id number]  DATE OF BIRTH:  June 03, 1922  SUBJECTIVE:  CHIEF COMPLAINT:   Chief Complaint  Patient presents with  . Skin Ulcer   Advanced dementia and bed bound. History obtained from daughters at bedside. afebrile  REVIEW OF SYSTEMS:    Review of Systems  Unable to perform ROS: Dementia    DRUG ALLERGIES:   Allergies  Allergen Reactions  . Ace Inhibitors Cough  . Ciprofloxacin Other (See Comments)  . Other Other (See Comments)  . Penicillins Rash  . Sulfa Antibiotics Itching, Rash and Other (See Comments)    VITALS:  Blood pressure (!) 159/68, pulse 80, temperature 98.7 F (37.1 C), temperature source Axillary, resp. rate 20, weight 72.1 kg (159 lb), SpO2 100 %.  PHYSICAL EXAMINATION:   Physical Exam  GENERAL:  83 y.o.-year-old patient lying in the bed with no acute distress.  HEENT: Head atraumatic, normocephalic. Oropharynx and nasopharynx clear.  NECK:  Supple, no jugular venous distention. No thyroid enlargement, no tenderness.  LUNGS: Normal breath sounds bilaterally, no wheezing, rales, rhonchi. No use of accessory muscles of respiration.  CARDIOVASCULAR: S1, S2 normal. No murmurs, rubs, or gallops.  ABDOMEN: Soft, nontender, nondistended. Bowel sounds present. EXTREMITIES: No cyanosis, clubbing or edema b/l.    NEUROLOGIC: does not follow instructions PSYCHIATRIC: The patient is alert and awake. SKIN: Sacral decubitus ulcer. Right heel ulcer  LABORATORY PANEL:   CBC  Recent Labs Lab 09/08/16 0425  WBC 11.0  HGB 11.4*  HCT 34.0*  PLT 253   ------------------------------------------------------------------------------------------------------------------ Chemistries   Recent Labs Lab 09/08/16 0425  NA 139  K 4.1  CL 109  CO2 24  GLUCOSE 128*  BUN 29*  CREATININE 1.68*  CALCIUM 8.5*    ------------------------------------------------------------------------------------------------------------------  Cardiac Enzymes  Recent Labs Lab 09/07/16 1117  TROPONINI <0.03   ------------------------------------------------------------------------------------------------------------------  RADIOLOGY:  No results found.   ASSESSMENT AND PLAN:   * Sacral decubitus ulcer with erythema On IV abx. Appreciate wound care team help Added PO fluconazole Air mattress Dressing changes.  * Right heel ulcer Healing. Stage 3 but small  * DM SSI  * Dementia Watch for inpatient delirium  * DVT prophylaxis Lovenox  All the records are reviewed and case discussed with Care Management/Social Worker Management plans discussed with the patient, family and they are in agreement.  CODE STATUS: DNR  DVT Prophylaxis: SCDs  TOTAL TIME TAKING CARE OF THIS PATIENT: 30 minutes.   POSSIBLE D/C IN 1-2 DAYS, DEPENDING ON CLINICAL CONDITION.  Milagros LollSudini, Philipp Callegari R M.D on 09/10/2016 at 6:29 AM  Between 7am to 6pm - Pager - [phone]  After 6pm go to [email or web address] - password [email or web address]  [facility] [place] [facility]  Office  [phone]  CC: Primary care physician; System, Pcp Not In  Note: This dictation was prepared with Dragon dictation along with smaller phrase technology. Any transcriptional errors that result from this process are unintentional.

## 2016-09-10 NOTE — Care Management Important Message (Signed)
Important Message  Patient Details  Name: Wilson SingerHilda C Maynez MRN: [id number] Date of Birth: 1922-07-09   Medicare Important Message Given:  Yes  Daughter Gordy LevanLea [place] Telephone conversation    Gwenette GreetBrenda S Anavictoria Wilk, RN 09/10/2016, 10:58 AM

## 2016-09-10 NOTE — Evaluation (Signed)
Physical Therapy Evaluation Patient Details Name: Heather SingerHilda C Curtis MRN: [id number] DOB: [birth year] Today's Date: 09/10/2016   History of Present Illness  (P) Pt is a 83 yo F admitted to acute [facility] for decubitus ulcer, stage 2 with infection on 7/28. Prior to admission, pt was participating in functional mobility, using RW for amb. PMH: CKD, dementia, diabetes mellitus, and HTN.   Clinical Impression  Pt with dementia and highly confused; follows one-step commands ~30% of the time, and is highly fearful of functional mobility. Spoke with [facility] unit, who confirmed pt amb short distances with RW prior to admission and dx of pressure ulcers. Pt performs bed mobility rolling with ModA and max cues and supine to sit with total assist +2. Poor sitting balance, as pt presents with pushing posteriorly, due to high fear and pain with sitting upright. Transfers and amb deferred due to safety purposes, severely impaired strength to B LE's, and pain. Pt would benefit from skilled PT to address the previously mentioned impairments and promote return to PLOF. Currently recommending SNF, pending d/c.      Follow Up Recommendations SNF    Equipment Recommendations  None recommended by PT    Recommendations for Other Services       Precautions / Restrictions Precautions Precautions: Fall Restrictions Weight Bearing Restrictions: No      Mobility  Bed Mobility Overal bed mobility: Needs Assistance Bed Mobility: Rolling;Supine to Sit Rolling: Mod assist   Supine to sit: Total assist;+2 for physical assistance     General bed mobility comments: Pt modA +1 for rolling L and R, requiring max verbal, tactile, and visual cues. Pt total asssit +2 for supine to sit and sit to supine, requiring max cues for pt to assist, but unable to follow commands; intermitently resistant with sitting (pushing posteriorly) and hollaring.   Transfers                 General transfer comment:  Not assessed due to safety concerns, as pt was resistant and scared with sitting at EOB.   Ambulation/Gait             General Gait Details: Not assessed due to safety concerns, as pt was resistant and scared with sitting at EOB.   Stairs            Wheelchair Mobility    Modified Rankin (Stroke Patients Only)       Balance Overall balance assessment: Needs assistance Sitting-balance support: Bilateral upper extremity supported;Feet supported Sitting balance-Leahy Scale: Poor Sitting balance - Comments: Poor sitting balance, requiring total assist +2, as pt was highly resistant and pushing posteriorly with sitting.                                     Pertinent Vitals/Pain Pain Assessment: Faces Faces Pain Scale: Hurts even more (With movement) Pain Location: Sacrum and R heel Pain Descriptors / Indicators: Discomfort;Grimacing;Moaning Pain Intervention(s): Limited activity within patient's tolerance;Monitored during session;Repositioned    Home Living Family/patient expects to be discharged to:: Skilled nursing facility                 Additional Comments: No family available. Called [facility] unit and spoke with staff, who reported pt baseline being bed ridden for past 3 weeks, due to sacral ulcer. States she amb short distances with RW prior to the past 3 weeks.  Prior Function Level of Independence: Needs assistance      ADL's / Homemaking Assistance Needed: Pt. unable to answer questions about PLOF, living environment        Hand Dominance        Extremity/Trunk Assessment   Upper Extremity Assessment Upper Extremity Assessment: Generalized weakness (MMT to B UE's grossly 4-/5)    Lower Extremity Assessment Lower Extremity Assessment: Generalized weakness (MMT to B LE's grossly 3/5 )       Communication   Communication: HOH  Cognition Arousal/Alertness: Awake/alert Behavior During Therapy: Flat  affect Overall Cognitive Status: History of cognitive impairments - at baseline                                 General Comments: Pt. has Dementia. Responds ~30% of the time to one-step commands.       General Comments      Exercises Other Exercises Other Exercises: Supine therex performed to B LE's with Max-total assist x 10 reps, as pt was minimally able to follow commands: ankle pumps, SLR, and hip abd. Responds best to rhythmic tones of voice and holding pt hand. Other Exercises: toileting: ModA with rolling L and R for RN to apply ext cath.    Assessment/Plan    PT Assessment Patient needs continued PT services  PT Problem List Decreased strength;Decreased activity tolerance;Decreased balance;Decreased mobility;Decreased coordination;Decreased cognition;Decreased safety awareness;Decreased knowledge of precautions;Pain;Decreased skin integrity       PT Treatment Interventions DME instruction;Gait training;Functional mobility training;Therapeutic activities;Therapeutic exercise;Balance training;Neuromuscular re-education;Patient/family education;Modalities    PT Goals (Current goals can be found in the [facility] Plan section)  Acute Rehab PT Goals Patient Stated Goal: Unable to state PT Goal Formulation: With patient Time For Goal Achievement: 09/24/16 Potential to Achieve Goals: Fair Additional Goals Additional Goal #1: Pt to perform SLR to B LE's with MinA  x10 reps for greater strength and progression towards return to PLOF.    Frequency Min 2X/week   Barriers to discharge        Co-evaluation               AM-PAC PT "6 Clicks" Daily Activity  Outcome Measure Difficulty turning over in bed (including adjusting bedclothes, sheets and blankets)?: Total Difficulty moving from lying on back to sitting on the side of the bed? : Total Difficulty sitting down on and standing up from a chair with arms (e.g., wheelchair, bedside commode, etc,.)?: Total Help  needed moving to and from a bed to chair (including a wheelchair)?: Total Help needed walking in hospital room?: Total Help needed climbing 3-5 steps with a railing? : Total 6 Click Score: 6    End of Session Equipment Utilized During Treatment: Gait belt Activity Tolerance: Patient limited by pain;Other (comment) (fear of movement) Patient left: in bed;with nursing/sitter in room Nurse Communication: Mobility status PT Visit Diagnosis: Unsteadiness on feet (R26.81);Repeated falls (R29.6);Muscle weakness (generalized) (M62.81);Pain    Time: [id number] PT Time Calculation (min) (ACUTE ONLY): 35 min   Charges:         PT G Codes:        Sharman CheekLaura Iliani Vejar PT, SPT  Latanya MaudlinLaura M Starr Urias 09/10/2016, 5:10 PM

## 2016-09-10 NOTE — Evaluation (Signed)
Occupational Therapy Evaluation Patient Details Name: Heather [facility] MRN: [id number] DOB: Jun 29, 1922 Today's Date: 09/10/2016    History of Present Illness Pt. is a 83 y.o. female who [facility] with sacral decubitus ulcer with cellulitis. PMHx includes: heel ulcers, and Dementia.   Clinical Impression   Pt. Was alert and Ox1 to name only. No family present wot provide pt. Functional history information, or PLOF. Pt. Had difficulty following simple one step commands with increased time. Pt. Was leaning excessively to the right upon arrival. Pt. Was total assist x2 to reposition in bed in midline, with no initiation to assist in repositioning. Pt. Requires pillows positioned at her right side. Pt. Requires extensive assist with ADL tasks at this time. Pt. Could benefit from skilled OT services for positioning, and to improve engagement in light/basic ADL care. Pt. Would benefit from SNF level of care upon discharge.     Follow Up Recommendations  SNF    Equipment Recommendations       Recommendations for Other Services       Precautions / Restrictions                                                      ADL either performed or assessed with clinical judgement   ADL Overall ADL's : Needs assistance/impaired Eating/Feeding: Total assistance   Grooming: Total assistance   Upper Body Bathing: Total assistance   Lower Body Bathing: Total assistance   Upper Body Dressing : Total assistance   Lower Body Dressing: Total assistance   Toilet Transfer: Total assistance           Functional mobility during ADLs: Total assistance General ADL Comments: Pt. has difficulty following simple commands for light grooming. Poor initiation.     Vision Baseline Vision/History:  (Unable to determine)       Perception     Praxis      Pertinent Vitals/Pain Pain Assessment: No/denies pain     Hand Dominance     Extremity/Trunk Assessment Upper  Extremity Assessment Upper Extremity Assessment: Generalized weakness           Communication Communication Communication: HOH   Cognition   Behavior During Therapy: Flat affect Overall Cognitive Status: History of cognitive impairments - at baseline                                 General Comments: Pt. has Dementia.   General Comments       Exercises     Shoulder Instructions      Home Living Family/patient expects to be discharged to:: Skilled nursing facility                                        Prior Functioning/Environment Level of Independence: Needs assistance    ADL's / Homemaking Assistance Needed: Pt. unable to answer questions about PLOF, living environment            OT Problem List: Decreased strength;Decreased activity tolerance;Decreased cognition;Decreased safety awareness      OT Treatment/Interventions: Self-care/ADL training;Therapeutic exercise;Cognitive remediation/compensation;Therapeutic activities;Patient/family education;DME and/or AE instruction    OT Goals(Current goals can be found in the care  plan section) Acute Rehab OT Goals Patient Stated Goal: Unable to state OT Goal Formulation: With patient  OT Frequency: Min 1X/week   Barriers to D/C: Decreased caregiver support          Co-evaluation              AM-PAC PT "6 Clicks" Daily Activity     Outcome Measure Help from another person eating meals?: Total Help from another person taking care of personal grooming?: Total Help from another person toileting, which includes using toliet, bedpan, or urinal?: Total Help from another person bathing (including washing, rinsing, drying)?: Total Help from another person to put on and taking off regular upper body clothing?: Total Help from another person to put on and taking off regular lower body clothing?: Total 6 Click Score: 6   End of Session    Activity Tolerance: Other (comment)  (Cognition) Patient left: in bed;with bed alarm set;with call bell/phone within reach  OT Visit Diagnosis: Muscle weakness (generalized) (M62.81)                Time: 1455-1310 OT Time Calculation (min): 1335 min Charges:  OT General Charges $OT Visit: 1 Procedure OT Evaluation $OT Eval Low Complexity: 1 Procedure G-Codes:     Olegario MessierElaine Reisha Wos, MS, OTR/L   Olegario MessierElaine Kaedence Connelly, MS, OTR/L 09/10/2016, 3:31 PM

## 2016-09-10 NOTE — Clinical Social Work Note (Addendum)
CS[facility] with [facility] and facility is unable to accept pt due to the stage of pt's wounds. Pt is in need of a higher level of care for wound care. CSW presented bed offers, pt's daughter has chosen [facility]. CSW confirmed bed availability and facility has started [place]. CSW has also requested a PT/OT eval for auth as per pt's daughter's report, pt was not bed bound and has limited mobility. MD is aware. CSW will continue to follow.   Dede QuerySarah Zonie Crutcher, MSW, LCSW  Clinical Social Worker [phone]  ADDENDUM: CSW was notified by [facility] that they would not have a bed until Sunday. CSW updated family and they requested that CSW reach out to [facility], however they are not in network with [employer]. Family then chose [facility]. CSW confirmed bed availability and [facility] [employer] auth will be started. CSW will continue.  Dede QuerySarah Akaila Rambo, MSW, LCSW

## 2016-09-10 NOTE — Progress Notes (Signed)
Minerva Areolaric with [facility] [phone]) who followed pt before admission updated on current plan of care and will follow up daily until pt discharged to resume services.

## 2016-09-11 DIAGNOSIS — Z882 Allergy status to sulfonamides status: Secondary | ICD-10-CM | POA: Diagnosis not present

## 2016-09-11 DIAGNOSIS — E1122 Type 2 diabetes mellitus with diabetic chronic kidney disease: Secondary | ICD-10-CM | POA: Diagnosis not present

## 2016-09-11 DIAGNOSIS — L97419 Non-pressure chronic ulcer of right heel and midfoot with unspecified severity: Secondary | ICD-10-CM | POA: Diagnosis not present

## 2016-09-11 DIAGNOSIS — Z888 Allergy status to other drugs, medicaments and biological substances status: Secondary | ICD-10-CM | POA: Diagnosis not present

## 2016-09-11 DIAGNOSIS — R0602 Shortness of breath: Secondary | ICD-10-CM | POA: Diagnosis present

## 2016-09-11 DIAGNOSIS — L89613 Pressure ulcer of right heel, stage 3: Secondary | ICD-10-CM | POA: Diagnosis not present

## 2016-09-11 DIAGNOSIS — E11621 Type 2 diabetes mellitus with foot ulcer: Secondary | ICD-10-CM | POA: Diagnosis not present

## 2016-09-11 DIAGNOSIS — Z794 Long term (current) use of insulin: Secondary | ICD-10-CM | POA: Diagnosis not present

## 2016-09-11 DIAGNOSIS — Z79899 Other long term (current) drug therapy: Secondary | ICD-10-CM | POA: Diagnosis not present

## 2016-09-11 DIAGNOSIS — Z880 Allergy status to penicillin: Secondary | ICD-10-CM | POA: Diagnosis not present

## 2016-09-11 DIAGNOSIS — L03312 Cellulitis of back [any part except buttock]: Secondary | ICD-10-CM | POA: Diagnosis not present

## 2016-09-11 DIAGNOSIS — F0390 Unspecified dementia without behavioral disturbance: Secondary | ICD-10-CM | POA: Diagnosis not present

## 2016-09-11 DIAGNOSIS — Z7401 Bed confinement status: Secondary | ICD-10-CM | POA: Diagnosis not present

## 2016-09-11 DIAGNOSIS — N184 Chronic kidney disease, stage 4 (severe): Secondary | ICD-10-CM | POA: Diagnosis not present

## 2016-09-11 DIAGNOSIS — L89152 Pressure ulcer of sacral region, stage 2: Secondary | ICD-10-CM | POA: Diagnosis not present

## 2016-09-11 DIAGNOSIS — Z883 Allergy status to other anti-infective agents status: Secondary | ICD-10-CM | POA: Diagnosis not present

## 2016-09-11 DIAGNOSIS — Z66 Do not resuscitate: Secondary | ICD-10-CM | POA: Diagnosis not present

## 2016-09-11 DIAGNOSIS — I129 Hypertensive chronic kidney disease with stage 1 through stage 4 chronic kidney disease, or unspecified chronic kidney disease: Secondary | ICD-10-CM | POA: Diagnosis not present

## 2016-09-11 LAB — GLUCOSE, CAPILLARY
GLUCOSE-CAPILLARY: 148 mg/dL — AB (ref 65–99)
GLUCOSE-CAPILLARY: 184 mg/dL — AB (ref 65–99)
GLUCOSE-CAPILLARY: 198 mg/dL — AB (ref 65–99)
Glucose-Capillary: 184 mg/dL — ABNORMAL HIGH (ref 65–99)

## 2016-09-11 MED ORDER — ENSURE ENLIVE PO LIQD
237.0000 mL | Freq: Two times a day (BID) | ORAL | Status: DC
  Administered 2016-09-11 – 2016-09-13 (×4): 237 mL via ORAL

## 2016-09-11 MED ORDER — ADULT MULTIVITAMIN LIQUID CH
15.0000 mL | Freq: Every day | ORAL | Status: DC
  Administered 2016-09-11 – 2016-09-13 (×3): 15 mL via ORAL
  Filled 2016-09-11 (×3): qty 15

## 2016-09-11 MED ORDER — LOSARTAN POTASSIUM 50 MG PO TABS
100.0000 mg | ORAL_TABLET | Freq: Every day | ORAL | Status: DC
Start: 2016-09-12 — End: 2016-09-13
  Administered 2016-09-12 – 2016-09-13 (×2): 100 mg via ORAL
  Filled 2016-09-11 (×2): qty 2

## 2016-09-11 MED ORDER — LOSARTAN POTASSIUM 50 MG PO TABS
50.0000 mg | ORAL_TABLET | Freq: Once | ORAL | Status: AC
  Administered 2016-09-11: 14:00:00 50 mg via ORAL
  Filled 2016-09-11: qty 1

## 2016-09-11 NOTE — Progress Notes (Signed)
[facility] - [place] [facility]   PATIENT NAME: Heather [facility]    MR#:  [id number]  DATE OF BIRTH:  Dec 17, 1922  SUBJECTIVE:  CHIEF COMPLAINT:   Chief Complaint  Patient presents with  . Skin Ulcer   Advanced dementia. Patient is nonverbal. Seems to ambulate with help according to daughter.  REVIEW OF SYSTEMS:    Review of Systems  Unable to perform ROS: Dementia    DRUG ALLERGIES:   Allergies  Allergen Reactions  . Ace Inhibitors Cough  . Ciprofloxacin Other (See Comments)  . Other Other (See Comments)  . Penicillins Rash  . Sulfa Antibiotics Itching, Rash and Other (See Comments)    VITALS:  Blood pressure (!) 173/69, pulse 66, temperature 97.8 F (36.6 C), temperature source Oral, resp. rate 17, weight 72.1 kg (159 lb), SpO2 100 %.  PHYSICAL EXAMINATION:   Physical Exam  GENERAL:  83 y.o.-year-old patient lying in the bed with no acute distress.  HEENT: Head atraumatic, normocephalic. Oropharynx and nasopharynx clear.  NECK:  Supple, no jugular venous distention. No thyroid enlargement, no tenderness.  LUNGS: Normal breath sounds bilaterally, no wheezing, rales, rhonchi. No use of accessory muscles of respiration.  CARDIOVASCULAR: S1, S2 normal. No murmurs, rubs, or gallops.  ABDOMEN: Soft, nontender, nondistended. Bowel sounds present. EXTREMITIES: No cyanosis, clubbing or edema b/l.    NEUROLOGIC: does not follow instructions PSYCHIATRIC: The patient is alert and awake. SKIN: Sacral decubitus ulcer. Right heel ulcer  LABORATORY PANEL:   CBC  Recent Labs Lab 09/08/16 0425  WBC 11.0  HGB 11.4*  HCT 34.0*  PLT 253   ------------------------------------------------------------------------------------------------------------------ Chemistries   Recent Labs Lab 09/08/16 0425  NA 139  K 4.1  CL 109  CO2 24  GLUCOSE 128*  BUN 29*  CREATININE 1.68*  CALCIUM 8.5*    ------------------------------------------------------------------------------------------------------------------  Cardiac Enzymes  Recent Labs Lab 09/07/16 1117  TROPONINI <0.03   ------------------------------------------------------------------------------------------------------------------  RADIOLOGY:  No results found.   ASSESSMENT AND PLAN:   * Sacral decubitus ulcer with erythema And cellulitis. Patient's cellulitis is improving with IV antibiotics. On IV abx. Appreciate wound care team help. Added PO fluconazole Air mattress Dressing changes. Discussed with wound care team.  * Right heel ulcer Healing. Stage 3 but small  * DM SSI  * Dementia Watch for inpatient delirium  * DVT prophylaxis Lovenox  All the records are reviewed and case discussed with Care Management/Social Worker Management plans discussed with the patient, family and they are in agreement.  CODE STATUS: DNR  DVT Prophylaxis: SCDs  TOTAL TIME TAKING CARE OF THIS PATIENT: 30 minutes.   Likely discharge in 1-2 days to rehabilitation.  Milagros LollSudini, Katina Remick R M.D on 09/11/2016 at 9:56 AM  Between 7am to 6pm - Pager - [phone]  After 6pm go to [email or web address] - password [email or web address]  [facility] [place] [facility]  Office  [phone]  CC: Primary care physician; System, Pcp Not In  Note: This dictation was prepared with Dragon dictation along with smaller phrase technology. Any transcriptional errors that result from this process are unintentional.

## 2016-09-11 NOTE — Progress Notes (Signed)
Initial Nutrition Assessment  DOCUMENTATION CODES:   Not applicable  INTERVENTION:   Ensure Enlive po BID, each supplement provides 350 kcal and 20 grams of protein  MVI  NUTRITION DIAGNOSIS:   Increased nutrient needs related to wound healing as evidenced by increased estimated needs from protein.  GOAL:   Patient will meet greater than or equal to 90% of their needs  MONITOR:   PO intake, Supplement acceptance, Labs, Weight trends, Skin  REASON FOR ASSESSMENT:   Low Braden    ASSESSMENT:   83 y.o. female with a known history of Hypertension, diabetes, CKD and dementia. The patient was sent from nursing home to the ED due to decubital ulcers, which is quite widespread.    Unable to get detailed nutrition related history as pt with dementia. Pt reports that she is hungry today and that she is usually a good eater. Per chart, pt documented to be eating 75-90% of meals. Pt had lunch tray on her table that had not been touched at time of RD visit. Per chart, pt has lost 8lbs(5%) over the past 2 months; this is not significant. Pt reports that she has never tried Ensure before but she would like to try strawberry; RD will order. Please encourage intake of meals and supplements.     Medications reviewed and include: clindamycin, fluconazole, heparin, insulin  Labs reviewed: BUN 29(H), creat 1.68(H), Ca 8.5(L)  Nutrition-Focused physical exam completed. Findings are no fat depletion, severe muscle depletion in bilateral calf regions, and no edema.   Diet Order:  Diet heart healthy/carb modified Room service appropriate? Yes; Fluid consistency: Thin  Skin:  Wound (see comment) (MASD buttocks and thighs, Stage III heel)  Last BM:  7/31  Height:   Ht Readings from Last 1 Encounters:  09/11/16 5\' 2"  (1.575 m)    Weight:   Wt Readings from Last 1 Encounters:  09/09/16 159 lb (72.1 kg)    Ideal Body Weight:  50 kg  BMI:  Body mass index is 29.08 kg/m.  Estimated  Nutritional Needs:   Kcal:  1400-1600kcal/day   Protein:  72-86g/day   Fluid:  >1.4L/day   EDUCATION NEEDS:   Education needs no appropriate at this time  Betsey Holidayasey Trevar Boehringer MS, RD, LDN Pager #[phone] After Hours Pager: [phone]

## 2016-09-11 NOTE — Progress Notes (Signed)
Physical Therapy Treatment Patient Details Name: Heather SingerHilda C Curtis MRN: [id number] DOB: 05/08/1922 Today's Date: 09/11/2016    History of Present Illness Pt is a 83 yo F admitted to acute care for decubitus ulcer, stage 2 with infection on 7/28. Prior to admission, pt was participating in functional mobility, using RW for amb. PMH: CKD, dementia, diabetes mellitus, and HTN.     PT Comments    Pt demonstrates improved ability to tolerate sitting upright at EOB. She requires maxA+1 for rolling R and L in the bed in order to clean her with the CNA asssiting. Heavy verbal and tactile cues provided to patient with rolling. She requires total assist +2 for supine to sit however only requires minA+2 for sit to supine. She does not follow commands for supine to sit and appears to resist. However once upright she initially requires maxA+1 but is able to progress to CGA only for balance. She is able to perform some seated LAQ with therapist before returning to bed. Pt is able to complete bed exercises with therapist. She requires heavy verbal and tactile cues to complete exercises and follow approximately 20-30% of simple one-step commands. Pt will benefit from PT services to address deficits in strength, balance, and mobility in order to return to full function at home.    Follow Up Recommendations  SNF     Equipment Recommendations  None recommended by PT    Recommendations for Other Services       Precautions / Restrictions Precautions Precautions: Fall Restrictions Weight Bearing Restrictions: No    Mobility  Bed Mobility Overal bed mobility: Needs Assistance Bed Mobility: Rolling;Supine to Sit;Sit to Supine Rolling: Max assist   Supine to sit: Total assist;+2 for physical assistance Sit to supine: Min assist   General bed mobility comments: Pt requires maxA+1 for rolling R and L in the bed in order to clean her with the CNA. Heavy verbal and tactile cues provided to patient with rolling.  She requires total assist +2 for supine to sit however only requires minA+2 for sit to supine. She does not follow commands for supine to sit and appears to resist. However once upright she initially requires maxA+1 but is able to progress to CGA only. She is able to perform some seated LAQ with therapist before returnign to bed  Transfers                 General transfer comment: Unsafe to attempt transfers or ambualtion at this time due to total assist for bed mobility and poor command follow  Ambulation/Gait                 Stairs            Wheelchair Mobility    Modified Rankin (Stroke Patients Only)       Balance Overall balance assessment: Needs assistance Sitting-balance support: Bilateral upper extremity supported Sitting balance-Leahy Scale: Fair Sitting balance - Comments: Initially poor but improves to fair with extended time in sitting                                    Cognition Arousal/Alertness: Awake/alert Behavior During Therapy: Flat affect Overall Cognitive Status: History of cognitive impairments - at baseline  General Comments: Pt is AOx1 at time of treatment. Responds to approximately 20-30% of one step commands      Exercises General Exercises - Lower Extremity Long Arc Quad: Strengthening;Both;5 reps;Seated Hip ABduction/ADduction: AROM;Both;10 reps;Supine;Other (comment) (Also performed x 10 adduction pillow squeezes with patient) Straight Leg Raises: Strengthening;Both;10 reps;Supine    General Comments        Pertinent Vitals/Pain Pain Assessment: Faces Faces Pain Scale: Hurts even more (Pt occasionally yells with movement, no crying or grimacing) Pain Intervention(s): Monitored during session    Home Living                      Prior Function            PT Goals (current goals can now be found in the care plan section) Acute Rehab PT  Goals Patient Stated Goal: Unable to state PT Goal Formulation: With patient Time For Goal Achievement: 09/24/16 Potential to Achieve Goals: Fair Progress towards PT goals: Progressing toward goals    Frequency    Min 2X/week      PT Plan Current plan remains appropriate    Co-evaluation              AM-PAC PT "6 Clicks" Daily Activity  Outcome Measure  Difficulty turning over in bed (including adjusting bedclothes, sheets and blankets)?: Total Difficulty moving from lying on back to sitting on the side of the bed? : Total Difficulty sitting down on and standing up from a chair with arms (e.g., wheelchair, bedside commode, etc,.)?: Total Help needed moving to and from a bed to chair (including a wheelchair)?: Total Help needed walking in hospital room?: Total Help needed climbing 3-5 steps with a railing? : Total 6 Click Score: 6    End of Session Equipment Utilized During Treatment: Gait belt Activity Tolerance: Other (comment) (Somewhat limited by command follow) Patient left: in bed;with bed alarm set;with call bell/phone within reach Nurse Communication: Other (comment) (Needs to reapply external catheter) PT Visit Diagnosis: Repeated falls (R29.6);Muscle weakness (generalized) (M62.81);Pain;Difficulty in walking, not elsewhere classified (R26.2)     Time: [id number] PT Time Calculation (min) (ACUTE ONLY): 25 min  Charges:  $Therapeutic Exercise: 8-22 mins $Therapeutic Activity: 8-22 mins                    G Codes:       Heather InkJason D Curtis PT, DPT     Heather Curtis 09/11/2016, 11:03 AM

## 2016-09-11 NOTE — Clinical Social Work Note (Signed)
CSW spoke with facility and Kathreen Cornfieldtena auth is pending. MD is aware. CSW will continue to follow.   Dede QuerySarah Masaki Rothbauer, MSW, LCSW  Clinical Social Worker  [phone]

## 2016-09-12 DIAGNOSIS — L89152 Pressure ulcer of sacral region, stage 2: Secondary | ICD-10-CM | POA: Diagnosis not present

## 2016-09-12 DIAGNOSIS — R0602 Shortness of breath: Secondary | ICD-10-CM | POA: Diagnosis not present

## 2016-09-12 LAB — GLUCOSE, CAPILLARY
GLUCOSE-CAPILLARY: 218 mg/dL — AB (ref 65–99)
GLUCOSE-CAPILLARY: 239 mg/dL — AB (ref 65–99)
GLUCOSE-CAPILLARY: 211 mg/dL — AB (ref 65–99)
Glucose-Capillary: 142 mg/dL — ABNORMAL HIGH (ref 65–99)

## 2016-09-12 LAB — CBC
HEMATOCRIT: 37 % (ref 35.0–47.0)
HEMOGLOBIN: 12.6 g/dL (ref 12.0–16.0)
MCH: 29.2 pg (ref 26.0–34.0)
MCHC: 33.9 g/dL (ref 32.0–36.0)
MCV: 86.1 fL (ref 80.0–100.0)
Platelets: 253 10*3/uL (ref 150–440)
RBC: 4.31 MIL/uL (ref 3.80–5.20)
RDW: 14 % (ref 11.5–14.5)
WBC: 10.6 10*3/uL (ref 3.6–11.0)

## 2016-09-12 NOTE — Progress Notes (Signed)
[facility] - [place] [facility]   PATIENT NAME: Heather Curtis    MR#:  [id number]  DATE OF BIRTH:  06/23/1922  SUBJECTIVE:  CHIEF COMPLAINT:   Chief Complaint  Patient presents with  . Skin Ulcer   Advanced dementia. Patient is nonverbal.  Afebrile  REVIEW OF SYSTEMS:    Review of Systems  Unable to perform ROS: Dementia    DRUG ALLERGIES:   Allergies  Allergen Reactions  . Ace Inhibitors Cough  . Ciprofloxacin Other (See Comments)  . Other Other (See Comments)  . Penicillins Rash  . Sulfa Antibiotics Itching, Rash and Other (See Comments)    VITALS:  Blood pressure 136/86, pulse 85, temperature 98.1 F (36.7 C), temperature source Oral, resp. rate 20, height 5\' 2"  (1.575 m), weight 72.1 kg (159 lb), SpO2 98 %.  PHYSICAL EXAMINATION:   Physical Exam  GENERAL:  83 y.o.-year-old patient lying in the bed with no acute distress.  HEENT: Head atraumatic, normocephalic. Oropharynx and nasopharynx clear.  NECK:  Supple, no jugular venous distention. No thyroid enlargement, no tenderness.  LUNGS: Normal breath sounds bilaterally, no wheezing, rales, rhonchi. No use of accessory muscles of respiration.  CARDIOVASCULAR: S1, S2 normal. No murmurs, rubs, or gallops.  ABDOMEN: Soft, nontender, nondistended. Bowel sounds present. EXTREMITIES: No cyanosis, clubbing or edema b/l.    NEUROLOGIC: does not follow instructions PSYCHIATRIC: The patient is alert and awake. SKIN: Sacral decubitus ulcer. Right heel ulcer  LABORATORY PANEL:   CBC  Recent Labs Lab 09/12/16 0439  WBC 10.6  HGB 12.6  HCT 37.0  PLT 253   ------------------------------------------------------------------------------------------------------------------ Chemistries   Recent Labs Lab 09/08/16 0425  NA 139  K 4.1  CL 109  CO2 24  GLUCOSE 128*  BUN 29*  CREATININE 1.68*  CALCIUM 8.5*    ------------------------------------------------------------------------------------------------------------------  Cardiac Enzymes  Recent Labs Lab 09/07/16 1117  TROPONINI <0.03   ------------------------------------------------------------------------------------------------------------------  RADIOLOGY:  No results found.   ASSESSMENT AND PLAN:   * Sacral decubitus ulcer with erythema And cellulitis. Patient's cellulitis is improving with IV antibiotics. On IV abx.  PO fluconazole Air mattress. Dressing changes per wound care team  * Right heel ulcer Healing. Stage 3 but small  * DM SSI  * Dementia Watch for inpatient delirium  * DVT prophylaxis Lovenox  All the records are reviewed and case discussed with Care Management/Social Worker Management plans discussed with the patient, family and they are in agreement.  CODE STATUS: DNR  DVT Prophylaxis: SCDs  TOTAL TIME TAKING CARE OF THIS PATIENT: 30 minutes.   Likely discharge in 1-2 days to rehabilitation.  Milagros LollSudini, Sair Faulcon R M.D on 09/12/2016 at 7:01 AM  Between 7am to 6pm - Pager - [phone]  After 6pm go to [email or web address] - password [email or web address]  [facility] [place] [facility]  Office  [phone]  CC: Primary care physician; System, Pcp Not In  Note: This dictation was prepared with Dragon dictation along with smaller phrase technology. Any transcriptional errors that result from this process are unintentional.

## 2016-09-12 NOTE — Plan of Care (Signed)
Problem: Education: Goal: Knowledge of [facility] Education information/materials will improve Outcome: Progressing Patient more alert and interactive this shift as reported from previous RN, patient has not been as talkative and interactive thus far this shift. Patient answering questions and responding appropriately to most questions asked. Taking medications, crushed in applesauce. Patient on low air rotating mattress to help with skin breakdown. External catheter changed this shift. Encouraged PO intake of ensure and meals. Patient resting most of the shift, but does respond quicly to vocal stimuli. Skin addressed per orders. Patient still awaiting formal placement pending insur auth.

## 2016-09-12 NOTE — Progress Notes (Signed)
[facility] - [place] at [facility]   PATIENT NAME: Heather CoveHilda Oetken    MR#:  [id number]  DATE OF BIRTH:  11/23/1922  SUBJECTIVE:  CHIEF COMPLAINT:   Chief Complaint  Patient presents with  . Skin Ulcer   Advanced dementia. Patient is nonverbal.  Afebrile  REVIEW OF SYSTEMS:    Review of Systems  Unable to perform ROS: Dementia    DRUG ALLERGIES:   Allergies  Allergen Reactions  . Ace Inhibitors Cough  . Ciprofloxacin Other (See Comments)  . Other Other (See Comments)  . Penicillins Rash  . Sulfa Antibiotics Itching, Rash and Other (See Comments)    VITALS:  Blood pressure (!) 159/66, pulse 80, temperature 98.1 F (36.7 C), temperature source Oral, resp. rate 20, height 5\' 2"  (1.575 m), weight 72.1 kg (159 lb), SpO2 99 %.  PHYSICAL EXAMINATION:   Physical Exam  GENERAL:  83 y.o.-year-old patient lying in the bed with no acute distress.  HEENT: Head atraumatic, normocephalic. Oropharynx and nasopharynx clear.  NECK:  Supple, no jugular venous distention. No thyroid enlargement, no tenderness.  LUNGS: Normal breath sounds bilaterally, no wheezing, rales, rhonchi. No use of accessory muscles of respiration.  CARDIOVASCULAR: S1, S2 normal. No murmurs, rubs, or gallops.  ABDOMEN: Soft, nontender, nondistended. Bowel sounds present. EXTREMITIES: No cyanosis, clubbing or edema b/l.    NEUROLOGIC: does not follow instructions PSYCHIATRIC: The patient is alert and awake. SKIN: Sacral decubitus ulcer. Right heel ulcer  LABORATORY PANEL:   CBC  Recent Labs Lab 09/12/16 0439  WBC 10.6  HGB 12.6  HCT 37.0  PLT 253   ------------------------------------------------------------------------------------------------------------------ Chemistries   Recent Labs Lab 09/08/16 0425  NA 139  K 4.1  CL 109  CO2 24  GLUCOSE 128*  BUN 29*  CREATININE 1.68*  CALCIUM 8.5*    ------------------------------------------------------------------------------------------------------------------  Cardiac Enzymes  Recent Labs Lab 09/07/16 1117  TROPONINI <0.03   ------------------------------------------------------------------------------------------------------------------  RADIOLOGY:  No results found.   ASSESSMENT AND PLAN:   * Sacral decubitus ulcer with erythema And cellulitis. Patient's cellulitis is improving with IV antibiotics. On IV abx.  PO fluconazole Air mattress. Dressing changes per wound care team  * Right heel ulcer Healing. Stage 3 but small  * DM SSI  * Dementia Watch for inpatient delirium  * DVT prophylaxis Lovenox  All the records are reviewed and case discussed with Care Management/Social Worker Management plans discussed with the patient, family and they are in agreement.  CODE STATUS: DNR  DVT Prophylaxis: SCDs  TOTAL TIME TAKING CARE OF THIS PATIENT: 30 minutes.   Waiting for insurance authorization  Milagros LollSudini, Rod Majerus R M.D on 09/12/2016 at 11:47 AM  Between 7am to 6pm - Pager - [phone]  After 6pm go to [email or web address] - password [email or web address]  [facility] [place] [facility]  Office  [phone]  CC: Primary care physician; System, Pcp Not In  Note: This dictation was prepared with Dragon dictation along with smaller phrase technology. Any transcriptional errors that result from this process are unintentional.

## 2016-09-13 DIAGNOSIS — L89152 Pressure ulcer of sacral region, stage 2: Secondary | ICD-10-CM | POA: Diagnosis not present

## 2016-09-13 DIAGNOSIS — R0602 Shortness of breath: Secondary | ICD-10-CM | POA: Diagnosis not present

## 2016-09-13 LAB — GLUCOSE, CAPILLARY
GLUCOSE-CAPILLARY: 141 mg/dL — AB (ref 65–99)
Glucose-Capillary: 182 mg/dL — ABNORMAL HIGH (ref 65–99)

## 2016-09-13 MED ORDER — ADULT MULTIVITAMIN LIQUID CH: 15.0000 mL | Freq: Every day | ORAL | 0 refills | Status: AC

## 2016-09-13 NOTE — Progress Notes (Signed)
[facility] - [place] at [facility]   PATIENT NAME: Heather Curtis    MR#:  [id number]  DATE OF BIRTH:  Dec 14, 1922  SUBJECTIVE:   Advanced dementia. Patient responds some to basic VC Sitter in the room Afebrile  REVIEW OF SYSTEMS:    Review of Systems  Unable to perform ROS: Dementia    DRUG ALLERGIES:   Allergies  Allergen Reactions  . Ace Inhibitors Cough  . Ciprofloxacin Other (See Comments)  . Other Other (See Comments)  . Penicillins Rash  . Sulfa Antibiotics Itching, Rash and Other (See Comments)    VITALS:  Blood pressure (!) 144/57, pulse 69, temperature 98.2 F (36.8 C), temperature source Oral, resp. rate 19, height 5\' 2"  (1.575 m), weight 72.1 kg (159 lb), SpO2 98 %.  PHYSICAL EXAMINATION:   Physical Exam  GENERAL:  83 y.o.-year-old patient lying in the bed with no acute distress.  HEENT: Head atraumatic, normocephalic. Oropharynx and nasopharynx clear.  NECK:  Supple, no jugular venous distention. No thyroid enlargement, no tenderness.  LUNGS: Normal breath sounds bilaterally, no wheezing, rales, rhonchi. No use of accessory muscles of respiration.  CARDIOVASCULAR: S1, S2 normal. No murmurs, rubs, or gallops.  ABDOMEN: Soft, nontender, nondistended. Bowel sounds present. EXTREMITIES: No cyanosis, clubbing or edema b/l.    NEUROLOGIC: does not follow instructions PSYCHIATRIC: The patient is alert and awake. SKIN: Sacral decubitus ulcer. Right heel ulcer  LABORATORY PANEL:   CBC  Recent Labs Lab 09/12/16 0439  WBC 10.6  HGB 12.6  HCT 37.0  PLT 253   ------------------------------------------------------------------------------------------------------------------ Chemistries   Recent Labs Lab 09/08/16 0425  NA 139  K 4.1  CL 109  CO2 24  GLUCOSE 128*  BUN 29*  CREATININE 1.68*  CALCIUM 8.5*   ------------------------------------------------------------------------------------------------------------------  Cardiac  Enzymes  Recent Labs Lab 09/07/16 1117  TROPONINI <0.03   ------------------------------------------------------------------------------------------------------------------  RADIOLOGY:  No results found.   ASSESSMENT AND PLAN:   * Sacral decubitus ulcer with erythema And cellulitis. Patient's cellulitis is improving with antibiotics. On po clindamycin day 7/10 and PO fluconazole 6/10 Air mattress. Dressing changes [facility] wound care team [facility]  * Right heel ulcer Healing. Stage 3 but small  * DM SSI  * Dementia Watch for inpatient delirium  * DVT prophylaxis Lovenox  All the records are reviewed and case discussed with Care Management/Social Worker Management plans discussed with the patient, family and they are in agreement.  CODE STATUS: DNR  DVT Prophylaxis: SCDs  TOTAL TIME TAKING CARE OF THIS PATIENT: 25 minutes.   Waiting for insurance authorization  Elisabella Hacker M.D on 09/13/2016 at 8:35 AM  Between 7am to 6pm - Pager - [phone]  After 6pm go to [email or web address] - password [email or web address]  [facility] [place] [facility]  Office  [phone]  CC: Primary care physician; System, Pcp Not In  Note: This dictation was prepared with Dragon dictation along with smaller phrase technology. Any transcriptional errors that result from this process are unintentional.

## 2016-09-13 NOTE — Clinical Social Work Note (Signed)
Pt is ready for discharge today and [facility]. Monia Pouchetna Berkley Harveyauth has been obtained. Facility is ready to admit pt as they have received discharge information. Pt's daughter is aware and agreeable with discharge plan. RN will call report. [facility] will provide [facility]. CSW is signing off as no further needs identified.   Dede QuerySarah Maddelynn Moosman, MSW, LCSW  Clinical Social Worker  [phone]

## 2016-09-13 NOTE — Progress Notes (Signed)
Report called to Victorino DikeJennifer, Charity fundraiserN at [facility]. EMS notified of need for transport. AVS included in discharge packet.

## 2016-09-13 NOTE — Clinical Social Work Placement (Signed)
   CLINICAL SOCIAL WORK PLACEMENT  NOTE  Date:  09/13/2016  Patient Details  Name: Heather Curtis MRN: [id number] Date of Birth: 05-Nov-1922  Clinical Social Work is seeking post-discharge placement for this patient at the Skilled  Nursing Facility level of care (*CSW will initial, date and re-position this form in  chart as items are completed):  Yes   Patient/family provided with [facility] Clinical Social Work Department's list of facilities offering this level of care within the geographic area requested by the patient (or if unable, by the patient's family).  Yes   Patient/family informed of their freedom to choose among providers that offer the needed level of care, that participate in Medicare, Medicaid or managed care program needed by the patient, have an available bed and are willing to accept the patient.  Yes   Patient/family informed of [facility]'s ownership interest in [facility] and [facility], as well as of the fact that they are under no obligation to receive care at these facilities.  PASRR submitted to EDS on       PASRR number received on       Existing PASRR number confirmed on 09/10/16     FL2 transmitted to all facilities in geographic area requested by pt/family on 09/10/16     FL2 transmitted to all facilities within larger geographic area on       Patient informed that his/her managed care company has contracts with or will negotiate with certain facilities, including the following:        Yes   Patient/family informed of bed offers received.  [facility] [place]     Physician recommends and patient chooses bed at      [facility]  on 09/13/16.  [facility]     Patient family notified on 09/13/16 of transfer.  Name of family member notified:  [name]     PHYSICIAN       Additional Comment:     _______________________________________________ Dede QuerySarah Leroy Pettway, LCSW 09/13/2016, 2:04 PM

## 2016-09-13 NOTE — Care Management Important Message (Signed)
Important Message  Patient Details  Name: Heather Curtis MRN: [id number] Date of Birth: [birth year]   Medicare Important Message Given:  Yes    Gwenette GreetBrenda S Linder Prajapati, RN 09/13/2016, 9:00 AM

## 2016-09-13 NOTE — Discharge Summary (Signed)
[facility] [place] [facility]   PATIENT NAME: Heather Curtis    MR#:  [id number]  DATE OF BIRTH:  [birth year]  DATE OF ADMISSION:  09/07/2016 ADMITTING PHYSICIAN: Shaune PollackQing Chen, MD  DATE OF DISCHARGE: 09/13/2016  PRIMARY CARE PHYSICIAN: System, Pcp Not In   ADMISSION DIAGNOSIS:  SOB (shortness of breath) [R06.02] Wound infection [T14.8XXA, L08.9]  DISCHARGE DIAGNOSIS:     Decubitus ulcer, stage 2 with infection  SECONDARY DIAGNOSIS:   Past Medical History:  Diagnosis Date  . CKD (chronic kidney disease)   . Dementia   . Diabetes mellitus without complication (HCC)   . Hypertension      ADMITTING HISTORY  HISTORY OF PRESENT ILLNESS:  Heather CoveHilda Kokesh  is a 83 y.o. female with a known history of Hypertension, diabetes, CKD and dementia. The patient was sent from nursing home to the ED due to decubital ulcers, which is quite widespread. The ulcers are stage II with infection. It is unclear how long these ulcers have been there. Dr. Rexford MausMacShane started antibiotics for DU ulcer infection.   HOSPITAL COURSE:    * Sacral decubitus ulcer with cellulitis. Possible yeast. ON IV abx in hospital. Switch to PO clindamycin for 1 week. Seen by wound care team Added PO fluconazole for 1 week Air mattress Needs to be Turned every 2 hrs. Zinc oxide and lotrimin cream application Cx from superficial skin. No discharge. Not reliable. But patient does have cellulitis and needed IV antibiotics in the hospital.  * Right heel ulcer Healing Wound care to right posterior heel: Cleanse with NS, pat gently dry. Cover Stage 3 pressure Injury with xeroform gauze [place] [phone]) , top with dry gauze 4x4s and secure with Kerlix roll gauze/paper tape. Change twice daily. Place foot/feet into Prevalon boots for pressure redistribution. Needs repositioning every 2 hrs. Apply Lotrimin cream and Butt cream twice daily.  * DM Insulin. Well controlled  * HTN Losartan dose increased  *  Dementia stable  CONSULTS OBTAINED:   Wound care  DRUG ALLERGIES:   Allergies  Allergen Reactions  . Ace Inhibitors Cough  . Ciprofloxacin Other (See Comments)  . Other Other (See Comments)  . Penicillins Rash  . Sulfa Antibiotics Itching, Rash and Other (See Comments)    DISCHARGE MEDICATIONS:   Current Discharge Medication List    START taking these medications   Details  clindamycin (CLEOCIN) 300 MG capsule Take 1 capsule (300 mg total) by mouth 3 (three) times daily. Qty: 21 capsule, Refills: 0    clotrimazole (LOTRIMIN AF) 1 % cream Apply 1 application topically 2 (two) times daily. Apply to buttock wound Qty: 60 g, Refills: 0    fluconazole (DIFLUCAN) 100 MG tablet Take 1 tablet (100 mg total) by mouth daily. Qty: 7 tablet, Refills: 0    Hydrocortisone (GERHARDT'S BUTT CREAM) CREA Apply 1 application topically 3 (three) times daily. Apply in a thin payer to buttocks, posterior and medial thighs three times daily after cleansing with house skin cleanser. Turn patient side to side and minimize time spent in the supine position. Use only one underpad beneath patient. Qty: 1 each, Refills: 0    Multiple Vitamin (MULTIVITAMIN) LIQD Take 15 mLs by mouth daily. Qty: 300 mL, Refills: 0      CONTINUE these medications which have CHANGED   Details  LORazepam (ATIVAN) 0.5 MG tablet Take 1 tablet (0.5 mg total) by mouth every 8 (eight) hours as needed for anxiety. Qty: 10 tablet, Refills: 0  losartan (COZAAR) 100 MG tablet Take 1 tablet (100 mg total) by mouth daily. Qty: 30 tablet, Refills: 0      CONTINUE these medications which have NOT CHANGED   Details  donepezil (ARICEPT) 10 MG tablet Take 10 mg by mouth 3 (three) times daily.     glimepiride (AMARYL) 2 MG tablet Take 3 mg by mouth daily with breakfast.    insulin detemir (LEVEMIR) 100 UNIT/ML injection Inject 22 Units into the skin daily.     insulin lispro (HUMALOG) 100 UNIT/ML injection Inject 2-4 Units  into the skin 3 (three) times daily before meals. 2 units qam, 4 units @ noon, and 2 units qpm    pravastatin (PRAVACHOL) 40 MG tablet Take 40 mg by mouth at bedtime.         Today   VITAL SIGNS:  Blood pressure 140/63, pulse 76, temperature 98.3 F (36.8 C), temperature source Oral, resp. rate 19, height 5\' 2"  (1.575 m), weight 72.1 kg (159 lb), SpO2 97 %. No new complaints I/O:    Intake/Output Summary (Last 24 hours) at 09/13/16 1136 Last data filed at 09/13/16 0604  Gross per 24 hour  Intake                0 ml  Output              700 ml  Net             -700 ml    PHYSICAL EXAMINATION:  Physical Exam  GENERAL:  83 y.o.-year-old patient lying in the bed with no acute distress. LUNGS: Normal breath sounds bilaterally, no wheezing, rales,rhonchi or crepitation. No use of accessory muscles of respiration. CARDIOVASCULAR: S1, S2 normal. No murmurs, rubs, or gallops. ABDOMEN: Soft, non-tender, non-distended. Bowel sounds present. No organomegaly or mass. NEUROLOGIC: Moves all 4 extremities. PSYCHIATRIC: The patient is alert and awake. SKIN: Sacral area with erythema and yeast. Right heel stage 3 ulcer, clean base.  DATA REVIEW:   CBC  Recent Labs Lab 09/12/16 0439  WBC 10.6  HGB 12.6  HCT 37.0  PLT 253    Chemistries   Recent Labs Lab 09/08/16 0425  NA 139  K 4.1  CL 109  CO2 24  GLUCOSE 128*  BUN 29*  CREATININE 1.68*  CALCIUM 8.5*    Cardiac Enzymes  Recent Labs Lab 09/07/16 1117  TROPONINI <0.03    Microbiology Results  Results for orders placed or performed during the hospital encounter of 09/07/16  Wound or Superficial Culture     Status: None   Collection Time: 09/07/16  2:33 PM  Result Value Ref Range Status   Specimen Description BUTTOCKS  Final   Special Requests NONE  Final   Gram Stain   Final    MODERATE WBC PRESENT, PREDOMINANTLY PMN FEW SQUAMOUS EPITHELIAL CELLS PRESENT FEW GRAM POSITIVE COCCI IN PAIRS IN CLUSTERS RARE  GRAM POSITIVE RODS RARE BUDDING YEAST SEEN Performed at [facility], [address] [place], [place] [address]    Culture   Final    ABUNDANT METHICILLIN RESISTANT STAPHYLOCOCCUS AUREUS ABUNDANT ENTEROCOCCUS FAECALIS    Report Status 09/10/2016 FINAL  Final   Organism ID, Bacteria METHICILLIN RESISTANT STAPHYLOCOCCUS AUREUS  Final   Organism ID, Bacteria ENTEROCOCCUS FAECALIS  Final      Susceptibility   Enterococcus faecalis - MIC*    AMPICILLIN <=2 SENSITIVE Sensitive     VANCOMYCIN 1 SENSITIVE Sensitive     GENTAMICIN SYNERGY SENSITIVE Sensitive     *  ABUNDANT ENTEROCOCCUS FAECALIS   Methicillin resistant staphylococcus aureus - MIC*    CIPROFLOXACIN >=8 RESISTANT Resistant     ERYTHROMYCIN >=8 RESISTANT Resistant     GENTAMICIN <=0.5 SENSITIVE Sensitive     OXACILLIN >=4 RESISTANT Resistant     TETRACYCLINE <=1 SENSITIVE Sensitive     VANCOMYCIN <=0.5 SENSITIVE Sensitive     TRIMETH/SULFA <=10 SENSITIVE Sensitive     CLINDAMYCIN <=0.25 SENSITIVE Sensitive     RIFAMPIN <=0.5 SENSITIVE Sensitive     Inducible Clindamycin NEGATIVE Sensitive     * ABUNDANT METHICILLIN RESISTANT STAPHYLOCOCCUS AUREUS  MRSA PCR Screening     Status: Abnormal   Collection Time: 09/07/16  4:27 PM  Result Value Ref Range Status   MRSA by PCR POSITIVE (A) NEGATIVE Final    Comment:        The GeneXpert MRSA Assay (FDA approved for NASAL specimens only), is one component of a comprehensive MRSA colonization surveillance program. It is not intended to diagnose MRSA infection nor to guide or monitor treatment for MRSA infections. RESULT CALLED TO, READ BACK BY AND VERIFIED WITH: ROBIN THOMAS ON 09/07/16 AT 1900 [facility]     RADIOLOGY:  No results found.  Follow up with PCP in 1 week.  Management plans discussed with the patient, family and they are in agreement.  CODE STATUS:     Code Status Orders        Start     Ordered   09/07/16 1626  Do not attempt resuscitation (DNR)   Continuous    Question Answer Comment  In the event of cardiac or respiratory ARREST Do not call a "code blue"   In the event of cardiac or respiratory ARREST Do not perform Intubation, CPR, defibrillation or ACLS   In the event of cardiac or respiratory ARREST Use medication by any route, position, wound care, and other measures to relive pain and suffering. May use oxygen, suction and manual treatment of airway obstruction as needed for comfort.      09/07/16 1625    Code Status History    Date Active Date Inactive Code Status Order ID Comments User Context   02/17/2016  4:58 AM 02/21/2016 11:24 PM DNR [id number]  Ihor Austin, MD ED    Advance Directive Documentation     Most Recent Value  Type of Advance Directive  Out of facility DNR (pink MOST or yellow form), Healthcare Power of Attorney  Pre-existing out of facility DNR order (yellow form or pink MOST form)  Yellow form placed in chart (order not valid for inpatient use), Physician notified to receive inpatient order  "MOST" Form in Place?  -      TOTAL TIME TAKING CARE OF THIS PATIENT ON DAY OF DISCHARGE: more than 30 minutes.   Sindy Mccune M.D on 09/13/2016 at 11:36 AM  Between 7am to 6pm - Pager - [phone]  After 6pm go to [email or web address] - password [email or web address]  [facility]  Office  [phone]  CC: Primary care physician; System, Pcp Not In  Note: This dictation was prepared with Dragon dictation along with smaller phrase technology. Any transcriptional errors that result from this process are unintentional.

## 2016-12-31 NOTE — Progress Notes (Signed)
09/10/16 1600  PT Visit Information  Last PT Received On 09/10/16  Assistance Needed +2  History of Present Illness Pt is a 83 yo F admitted to acute care for decubitus ulcer, stage 2 with infection on 7/28. Prior to admission, pt was participating in functional mobility, using RW for amb. PMH: CKD, dementia, diabetes mellitus, and HTN.   Precautions  Precautions Fall  Restrictions  Weight Bearing Restrictions No  Home Living  Family/patient expects to be discharged to: Skilled nursing facility  Additional Comments No family available. [facility] and spoke with staff, who reported pt baseline being bed ridden for past 3 weeks, due to sacral ulcer. States she amb short distances with RW prior to the past 3 weeks.   Prior Function  Level of Independence Needs assistance  Communication  Communication HOH  Pain Assessment  Pain Assessment Faces  Faces Pain Scale 6 (With movement)  Pain Location Sacrum and R heel  Pain Descriptors / Indicators Discomfort;Grimacing;Moaning  Pain Intervention(s) Limited activity within patient's tolerance;Monitored during session;Repositioned  Cognition  Arousal/Alertness Awake/alert  Behavior During Therapy Flat affect  Overall Cognitive Status History of cognitive impairments - at baseline  General Comments Pt. has Dementia. Responds ~30% of the time to one-step commands.   Upper Extremity Assessment  Upper Extremity Assessment Generalized weakness (MMT to B UE's grossly 4-/5)  Lower Extremity Assessment  Lower Extremity Assessment Generalized weakness (MMT to B LE's grossly 3/5 )  Bed Mobility  Overal bed mobility Needs Assistance  Bed Mobility Rolling;Supine to Sit  Rolling Mod assist  Supine to sit Total assist;+2 for physical assistance  General bed mobility comments Pt modA +1 for rolling L and R, requiring max verbal, tactile, and visual cues. Pt total asssit +2 for supine to sit and sit to supine, requiring max  cues for pt to assist, but unable to follow commands; intermitently resistant with sitting (pushing posteriorly) and hollaring.   Transfers  General transfer comment Not assessed due to safety concerns, as pt was resistant and scared with sitting at EOB.   Ambulation/Gait  General Gait Details Not assessed due to safety concerns, as pt was resistant and scared with sitting at EOB.   Balance  Overall balance assessment Needs assistance  Sitting-balance support Bilateral upper extremity supported;Feet supported  Sitting balance-Leahy Scale Poor  Sitting balance - Comments Poor sitting balance, requiring total assist +2, as pt was highly resistant and pushing posteriorly with sitting.  Exercises  Exercises Other exercises  Other Exercises  Other Exercises Supine therex performed to B LE's with Max-total assist x 10 reps, as pt was minimally able to follow commands: ankle pumps, SLR, and hip abd. Responds best to rhythmic tones of voice and holding pt hand.  Other Exercises toileting: ModA with rolling L and R for RN to apply ext cath.   PT - End of Session  Equipment Utilized During Treatment Gait belt  Activity Tolerance Patient limited by pain;Other (comment) (fear of movement)  Patient left in bed;with nursing/sitter in room  Nurse Communication Mobility status  PT Assessment  PT Recommendation/Assessment Patient needs continued PT services  PT Visit Diagnosis Unsteadiness on feet (R26.81);Repeated falls (R29.6);Muscle weakness (generalized) (M62.81);Pain  PT Problem List Decreased strength;Decreased activity tolerance;Decreased balance;Decreased mobility;Decreased coordination;Decreased cognition;Decreased safety awareness;Decreased knowledge of precautions;Pain;Decreased skin integrity  PT Plan  PT Frequency (ACUTE ONLY) Min 2X/week  PT Treatment/Interventions (ACUTE ONLY) DME instruction;Gait training;Functional mobility training;Therapeutic activities;Therapeutic exercise;Balance  training;Neuromuscular re-education;Patient/family education;Modalities  AM-PAC PT "6 Clicks"  Daily Activity Outcome Measure  Difficulty turning over in bed (including adjusting bedclothes, sheets and blankets)? 1  Difficulty moving from lying on back to sitting on the side of the bed?  1  Difficulty sitting down on and standing up from a chair with arms (e.g., wheelchair, bedside commode, etc,.)? 1  Help needed moving to and from a bed to chair (including a wheelchair)? 1  Help needed walking in hospital room? 1  Help needed climbing 3-5 steps with a railing?  1  6 Click Score 6  Mobility G Code  CN  PT Recommendation  Follow Up Recommendations SNF  PT equipment None recommended by PT  Individuals Consulted  Consulted and Agree with Results and Recommendations Patient  Acute Rehab PT Goals  Patient Stated Goal Unable to state  PT Goal Formulation With patient  Time For Goal Achievement 09/24/16  Potential to Achieve Goals Fair  PT Time Calculation  PT Start Time (ACUTE ONLY) 1509  PT Stop Time (ACUTE ONLY) 1544  PT Time Calculation (min) (ACUTE ONLY) 35 min  PT G-Codes **NOT FOR INPATIENT CLASS**  Functional Assessment Tool Used AM-PAC 6 Clicks Basic Mobility  Functional Limitation Mobility: Walking and moving around  Mobility: Walking and Moving Around Current Status (W1191(G8978) CN  Mobility: Walking and Moving Around Goal Status (Y7829(G8979) CJ    Late-entry g-codes added after review of initial evaluation/documentation by Sharman CheekLaura Joyce (co-signed by Elly ModenaScott Tanner, PT).  Daemon Dowty H. Manson PasseyBrown, PT, DPT, NCS 12/31/16, 12:36 PM [phone]

## 2016-12-31 NOTE — Progress Notes (Signed)
   09/10/16 1530  OT Time Calculation  OT Start Time (ACUTE ONLY) 1455  OT Stop Time (ACUTE ONLY) 1310  OT Time Calculation (min) 1335 min  OT G-codes **NOT FOR INPATIENT CLASS**  Functional Assessment Tool Used AM-PAC 6 Clicks Daily Activity  Functional Limitation Self care  Self Care Current Status (Q4696(G8987) CN  Self Care Goal Status (E9528(G8988) CJ  OT General Charges  $OT Visit 1 Procedure  OT Evaluation  $OT Eval Low Complexity 1 Procedure    Late-entry g-codes added after review of initial evaluation/documentation by Olegario MessierElaine Jagentenfl, OT  Tommy RainwaterKristen H. Manson PasseyBrown, PT, DPT, NCS 12/31/16, 12:37 PM [phone]

## 2017-04-11 DEATH — deceased

## 2019-05-01 IMAGING — DX DG FOOT COMPLETE 3+V*R*
3 series · 3 of 3 positions shown · non-contrast
Comparison: Great toe radiograph 02/20/2015

CLINICAL DATA: Patient with worsening ulcer on the heel of the
right foot.

EXAM:
RIGHT FOOT COMPLETE - 3+ VIEW

[foot ap]
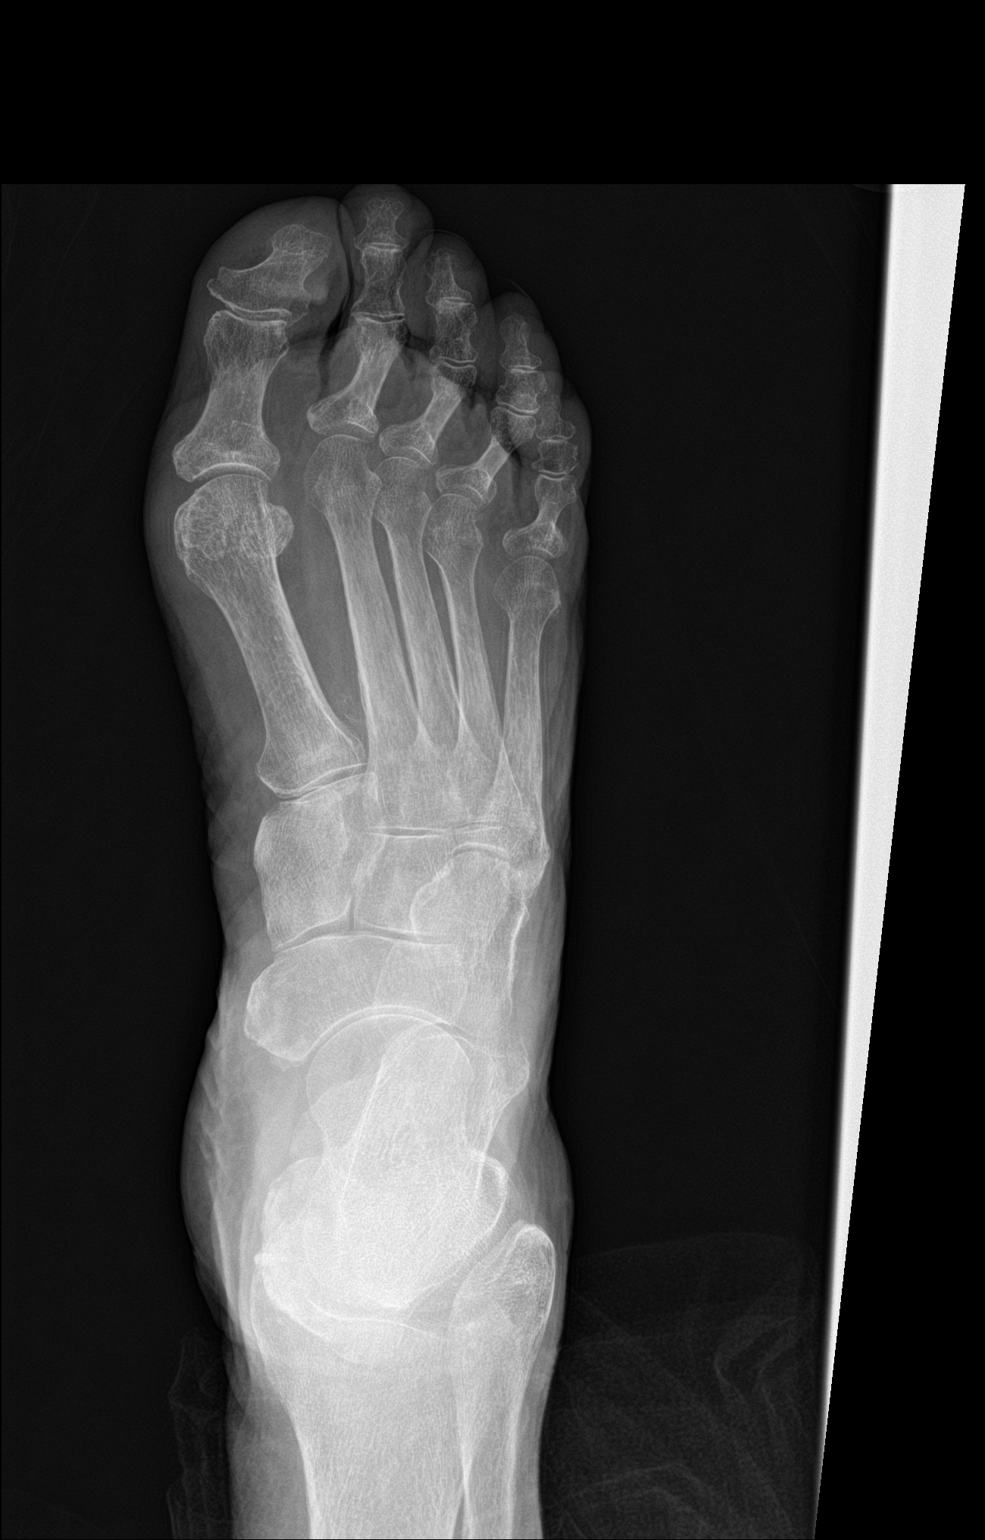

[foot obl]
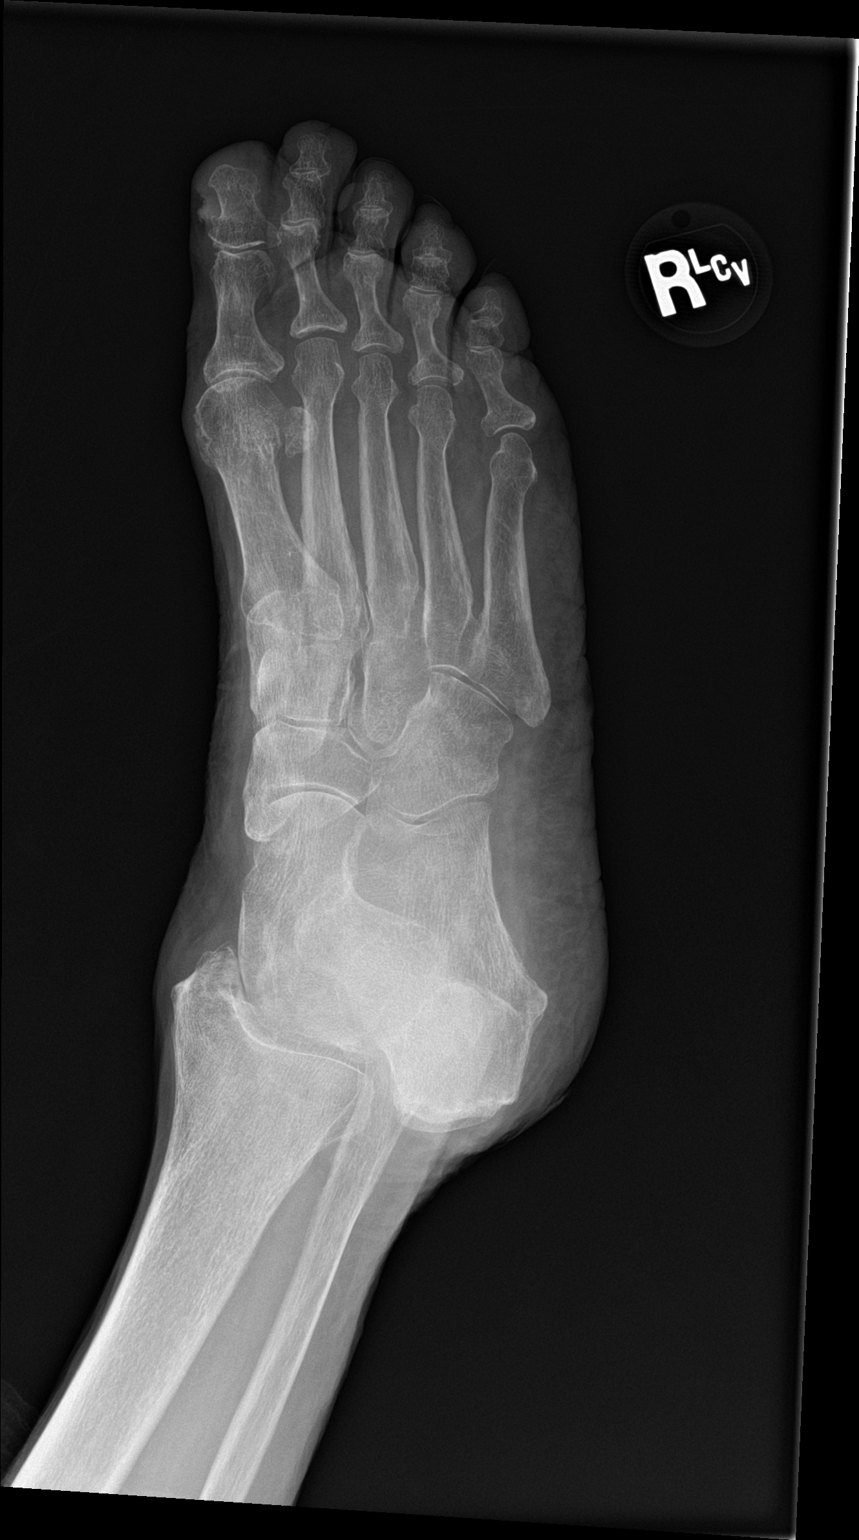

[foot lat]
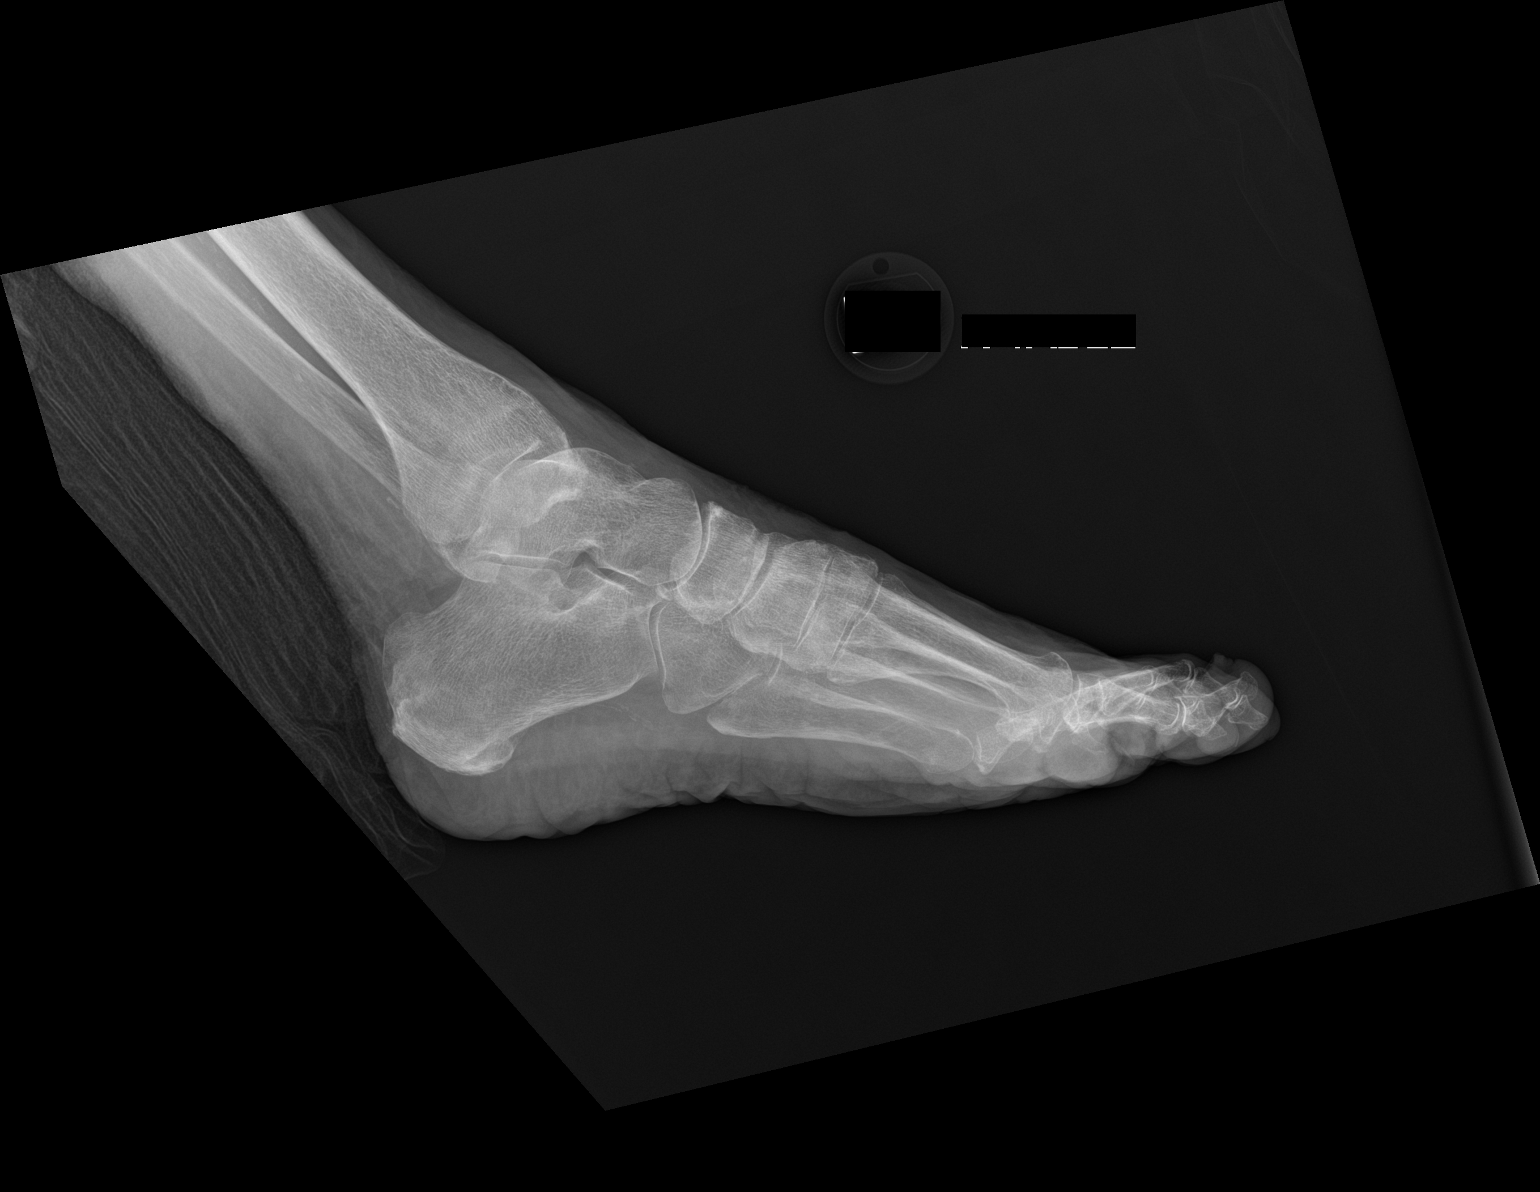

[3 of 3 positions shown; findings below may reference images not displayed]

FINDINGS: Normal anatomic alignment. No evidence for acute fracture or
dislocation. Mild midfoot degenerative changes. First MTP joint
degenerative changes. Vascular calcifications. No definite osseous
cortical destruction along the inferior aspect of the calcaneus to
suggest osteomyelitis.
IMPRESSION: No definite osseous cortical destruction to suggest osteomyelitis.
